# Patient Record
Sex: Male | Born: 1960 | Hispanic: Yes | Marital: Married | Smoking: Former smoker
Health system: Southern US, Community
[De-identification: ages and names within clinical notes are randomized; demographics above are authoritative.]

## PROBLEM LIST (undated history)

## (undated) DIAGNOSIS — D696 Thrombocytopenia, unspecified: Secondary | ICD-10-CM

## (undated) DIAGNOSIS — I10 Essential (primary) hypertension: Secondary | ICD-10-CM

## (undated) DIAGNOSIS — C8590 Non-Hodgkin lymphoma, unspecified, unspecified site: Secondary | ICD-10-CM

## (undated) DIAGNOSIS — E119 Type 2 diabetes mellitus without complications: Secondary | ICD-10-CM

## (undated) DIAGNOSIS — E785 Hyperlipidemia, unspecified: Secondary | ICD-10-CM

## (undated) DIAGNOSIS — K37 Unspecified appendicitis: Secondary | ICD-10-CM

## (undated) DIAGNOSIS — M722 Plantar fascial fibromatosis: Secondary | ICD-10-CM

## (undated) HISTORY — PX: UPPER GASTROINTESTINAL ENDOSCOPY: SHX188

## (undated) HISTORY — PX: COLONOSCOPY: SHX174

## (undated) HISTORY — PX: OTHER SURGICAL HISTORY: SHX169

## (undated) HISTORY — PX: ROTATOR CUFF REPAIR: SHX139

## (undated) HISTORY — DX: Thrombocytopenia, unspecified: D69.6

## (undated) HISTORY — PX: NECK LESION BIOPSY: SHX2078

## (undated) HISTORY — DX: Hyperlipidemia, unspecified: E78.5

## (undated) HISTORY — DX: Non-Hodgkin lymphoma, unspecified, unspecified site: C85.90

## (undated) HISTORY — DX: Unspecified appendicitis: K37

## (undated) HISTORY — DX: Plantar fascial fibromatosis: M72.2

---

## 2018-01-15 ENCOUNTER — Ambulatory Visit: Payer: Self-pay | Admitting: Physician Assistant

## 2018-08-13 DIAGNOSIS — E785 Hyperlipidemia, unspecified: Secondary | ICD-10-CM | POA: Diagnosis not present

## 2018-08-13 DIAGNOSIS — E1169 Type 2 diabetes mellitus with other specified complication: Secondary | ICD-10-CM | POA: Diagnosis not present

## 2018-09-05 DIAGNOSIS — Z794 Long term (current) use of insulin: Secondary | ICD-10-CM | POA: Diagnosis not present

## 2018-09-05 DIAGNOSIS — M75101 Unspecified rotator cuff tear or rupture of right shoulder, not specified as traumatic: Secondary | ICD-10-CM | POA: Diagnosis not present

## 2018-09-05 DIAGNOSIS — M5116 Intervertebral disc disorders with radiculopathy, lumbar region: Secondary | ICD-10-CM | POA: Diagnosis not present

## 2018-09-05 DIAGNOSIS — I10 Essential (primary) hypertension: Secondary | ICD-10-CM | POA: Diagnosis not present

## 2018-09-05 DIAGNOSIS — M75102 Unspecified rotator cuff tear or rupture of left shoulder, not specified as traumatic: Secondary | ICD-10-CM | POA: Diagnosis not present

## 2018-09-05 DIAGNOSIS — E119 Type 2 diabetes mellitus without complications: Secondary | ICD-10-CM | POA: Diagnosis not present

## 2018-09-05 DIAGNOSIS — E6609 Other obesity due to excess calories: Secondary | ICD-10-CM | POA: Diagnosis not present

## 2018-09-05 DIAGNOSIS — E785 Hyperlipidemia, unspecified: Secondary | ICD-10-CM | POA: Diagnosis not present

## 2018-09-05 DIAGNOSIS — R51 Headache: Secondary | ICD-10-CM | POA: Diagnosis not present

## 2018-09-05 DIAGNOSIS — G4700 Insomnia, unspecified: Secondary | ICD-10-CM | POA: Diagnosis not present

## 2018-12-18 ENCOUNTER — Telehealth (INDEPENDENT_AMBULATORY_CARE_PROVIDER_SITE_OTHER): Payer: Medicare HMO | Admitting: Emergency Medicine

## 2018-12-18 ENCOUNTER — Other Ambulatory Visit: Payer: Self-pay

## 2018-12-18 ENCOUNTER — Encounter: Payer: Self-pay | Admitting: Emergency Medicine

## 2018-12-18 DIAGNOSIS — E1159 Type 2 diabetes mellitus with other circulatory complications: Secondary | ICD-10-CM | POA: Diagnosis not present

## 2018-12-18 DIAGNOSIS — I152 Hypertension secondary to endocrine disorders: Secondary | ICD-10-CM | POA: Insufficient documentation

## 2018-12-18 DIAGNOSIS — I10 Essential (primary) hypertension: Secondary | ICD-10-CM

## 2018-12-18 DIAGNOSIS — E1165 Type 2 diabetes mellitus with hyperglycemia: Secondary | ICD-10-CM

## 2018-12-18 MED ORDER — ENALAPRIL-HYDROCHLOROTHIAZIDE 10-25 MG PO TABS: 1.0000 | ORAL_TABLET | Freq: Every day | ORAL | 1 refills | Status: DC

## 2018-12-18 MED ORDER — GLIPIZIDE 10 MG PO TABS: 10.0000 mg | ORAL_TABLET | Freq: Every day | ORAL | 1 refills | Status: DC

## 2018-12-18 NOTE — Progress Notes (Signed)
Telemedicine Encounter- SOAP NOTE Established Patient  This telephone encounter was conducted with the patient's (or proxy's) verbal consent via audio telecommunications: yes/no: Yes Patient was instructed to have this encounter in a suitably private space; and to only have persons present to whom they give permission to participate. In addition, patient identity was confirmed by use of name plus two identifiers (DOB and address).  I discussed the limitations, risks, security and privacy concerns of performing an evaluation and management service by telephone and the availability of in person appointments. I also discussed with the patient that there may be a patient responsible charge related to this service. The patient expressed understanding and agreed to proceed.  I spent a total of TIME; 0 MIN TO 60 MIN: 30 minutes talking with the patient or their proxy.  No chief complaint on file. Here to establish care  Subjective   Damon Dudley is a 58 y.o. male patient.  First visit to this office.  Telephone visit today to establish care.  Has the following chronic medical problems: 1.  Hypertension, on Vaseretic.  Under control. 2.  Diabetes, on metformin and glipizide, hypoglycemia at home, blood sugars between 180 and 210. 3.  Dyslipidemia, on statin. Asymptomatic.  No complaints or medical concerns today. Non-smoker.  Recently moved to the area from [place].   HPI   Patient Active Problem List   Diagnosis Date Noted  . Essential hypertension 12/18/2018    No past medical history on file.  Current Outpatient Medications  Medication Sig Dispense Refill  . atorvastatin (LIPITOR) 40 MG tablet Take 40 mg by mouth daily.    . enalapril-hydrochlorothiazide (VASERETIC) 10-25 MG tablet Take 1 tablet by mouth daily. 90 tablet 1  . glipiZIDE (GLUCOTROL) 10 MG tablet Take 1 tablet (10 mg total) by mouth daily before breakfast. 90 tablet 1  . Insulin Degludec (TRESIBA  FLEXTOUCH [place]) Inject 25 Units into the skin at bedtime.    . metFORMIN (GLUCOPHAGE) 1000 MG tablet Take 1,000 mg by mouth 2 (two) times daily with a meal.     No current facility-administered medications for this visit.     No Known Allergies  Social History   Socioeconomic History  . Marital status: Married    Spouse name: Not on file  . Number of children: Not on file  . Years of education: Not on file  . Highest education level: Not on file  Occupational History  . Not on file  Social Needs  . Financial resource strain: Not on file  . Food insecurity:    Worry: Not on file    Inability: Not on file  . Transportation needs:    Medical: Not on file    Non-medical: Not on file  Tobacco Use  . Smoking status: Not on file  Substance and Sexual Activity  . Alcohol use: Not on file  . Drug use: Not on file  . Sexual activity: Not on file  Lifestyle  . Physical activity:    Days per week: Not on file    Minutes per session: Not on file  . Stress: Not on file  Relationships  . Social connections:    Talks on phone: Not on file    Gets together: Not on file    Attends religious service: Not on file    Active member of club or organization: Not on file    Attends meetings of clubs or organizations: Not on file    Relationship  status: Not on file  . Intimate partner violence:    Fear of current or ex partner: Not on file    Emotionally abused: Not on file    Physically abused: Not on file    Forced sexual activity: Not on file  Other Topics Concern  . Not on file  Social History Narrative  . Not on file    Review of Systems  Constitutional: Negative.  Negative for chills, fever and weight loss.  HENT: Negative.  Negative for congestion, nosebleeds and sore throat.   Eyes: Negative.   Respiratory: Negative.  Negative for cough, shortness of breath and wheezing.   Cardiovascular: Negative.  Negative for chest pain and palpitations.  Gastrointestinal: Negative.   Negative for abdominal pain, blood in stool, diarrhea, nausea and vomiting.  Genitourinary: Negative.  Negative for dysuria.  Musculoskeletal: Negative for myalgias.  Skin: Negative.  Negative for rash.  Neurological: Negative.  Negative for dizziness and headaches.  Endo/Heme/Allergies: Negative.   All other systems reviewed and are negative.   Objective   Vitals as reported by the patient: None available There were no vitals filed for this visit.  Awake and oriented x3 in no respiratory distress.  With following: Surgical and protrusion remember referral yesterday yes she Max[place] trying to reach her GYN was the reason for her visit or maybe she was talking about today point been working on the last Diagnoses and all orders for this visit:  Hypertension associated with type 2 diabetes mellitus ([facility])  Essential hypertension -     enalapril-hydrochlorothiazide (VASERETIC) 10-25 MG tablet; Take 1 tablet by mouth daily.  Type 2 diabetes mellitus with hyperglycemia, without long-term current use of insulin ([facility]) -     glipiZIDE (GLUCOTROL) 10 MG tablet; Take 1 tablet (10 mg total) by mouth daily before breakfast.  Clinical stable.  Diabetes with hyperglycemia.  Continue present medications.  No change in medications.  Will adjust during office visit. Office visit in 1 to 2 months.    I discussed the assessment and treatment plan with the patient. The patient was provided an opportunity to ask questions and all were answered. The patient agreed with the plan and demonstrated an understanding of the instructions.   The patient was advised to call back or seek an in-person evaluation if the symptoms worsen or if the condition fails to improve as anticipated.  I provided 30 minutes of non-face-to-face time during this encounter.  Horald Pollen, MD  Primary Care at [facility]

## 2018-12-18 NOTE — Progress Notes (Signed)
Contacted patient to triage for appointment. Patient is new patient and need to establish care. Patient is a diabetic, he states yesterday his reading was 210 in the afternoon. Today this morning his reading was 187 after drinking coffee with powder creamer. Patient needs refills on enalapril-hctz and glipizide and these medications are pend.

## 2018-12-19 ENCOUNTER — Other Ambulatory Visit: Payer: Self-pay

## 2018-12-19 DIAGNOSIS — I10 Essential (primary) hypertension: Secondary | ICD-10-CM

## 2018-12-19 DIAGNOSIS — E1165 Type 2 diabetes mellitus with hyperglycemia: Secondary | ICD-10-CM

## 2018-12-19 MED ORDER — ENALAPRIL-HYDROCHLOROTHIAZIDE 10-25 MG PO TABS: 1.0000 | ORAL_TABLET | Freq: Every day | ORAL | 1 refills | Status: DC

## 2018-12-19 MED ORDER — GLIPIZIDE 10 MG PO TABS: 10.0000 mg | ORAL_TABLET | Freq: Every day | ORAL | 1 refills | Status: DC

## 2018-12-19 NOTE — Progress Notes (Signed)
Reordered to updated pharmacy, [place].

## 2019-01-16 DIAGNOSIS — Z03818 Encounter for observation for suspected exposure to other biological agents ruled out: Secondary | ICD-10-CM | POA: Diagnosis not present

## 2019-01-18 DIAGNOSIS — Z20828 Contact with and (suspected) exposure to other viral communicable diseases: Secondary | ICD-10-CM | POA: Diagnosis not present

## 2019-01-18 DIAGNOSIS — Z09 Encounter for follow-up examination after completed treatment for conditions other than malignant neoplasm: Secondary | ICD-10-CM | POA: Diagnosis not present

## 2019-02-27 ENCOUNTER — Telehealth: Payer: Self-pay | Admitting: Emergency Medicine

## 2019-02-27 NOTE — Telephone Encounter (Signed)
REFILL atorvastatin (LIPITOR) 40 MG tablet PHARMACY [facility] [place], [place] to [facility] [phone] (Phone) [phone] (Fax)

## 2019-03-02 ENCOUNTER — Encounter: Payer: Self-pay | Admitting: Emergency Medicine

## 2019-03-02 ENCOUNTER — Ambulatory Visit (INDEPENDENT_AMBULATORY_CARE_PROVIDER_SITE_OTHER): Payer: Medicare HMO | Admitting: Emergency Medicine

## 2019-03-02 ENCOUNTER — Other Ambulatory Visit: Payer: Self-pay

## 2019-03-02 VITALS — BP 107/72 | HR 100 | Temp 99.0°F | Resp 16 | Wt 184.8 lb

## 2019-03-02 DIAGNOSIS — E1165 Type 2 diabetes mellitus with hyperglycemia: Secondary | ICD-10-CM | POA: Diagnosis not present

## 2019-03-02 DIAGNOSIS — Z23 Encounter for immunization: Secondary | ICD-10-CM

## 2019-03-02 DIAGNOSIS — I10 Essential (primary) hypertension: Secondary | ICD-10-CM | POA: Diagnosis not present

## 2019-03-02 DIAGNOSIS — E1159 Type 2 diabetes mellitus with other circulatory complications: Secondary | ICD-10-CM | POA: Diagnosis not present

## 2019-03-02 MED ORDER — ENALAPRIL-HYDROCHLOROTHIAZIDE 10-25 MG PO TABS: 1.0000 | ORAL_TABLET | Freq: Every day | ORAL | 3 refills | Status: DC

## 2019-03-02 MED ORDER — ATORVASTATIN CALCIUM 40 MG PO TABS: 40.0000 mg | ORAL_TABLET | Freq: Every day | ORAL | 3 refills | Status: DC

## 2019-03-02 MED ORDER — GLIPIZIDE 10 MG PO TABS: 10.0000 mg | ORAL_TABLET | Freq: Every day | ORAL | 3 refills | Status: DC

## 2019-03-02 MED ORDER — METFORMIN HCL 1000 MG PO TABS: 1000.0000 mg | ORAL_TABLET | Freq: Two times a day (BID) | ORAL | 3 refills | Status: DC

## 2019-03-02 NOTE — Assessment & Plan Note (Signed)
Morning glucose at home elevated.  Will increase dose of Tresiba to 30 units at bedtime.  Follow-up in 3 months.

## 2019-03-02 NOTE — Patient Instructions (Addendum)
   If you have lab work done today you will be contacted with your lab results within the next 2 weeks.  If you have not heard from us then please contact us. The fastest way to get your results is to register for My Chart.   IF you received an x-ray today, you will receive an invoice from [facility]. Please contact [facility] at [phone] with questions or concerns regarding your invoice.   IF you received labwork today, you will receive an invoice from [facility]. Please contact [facility] at [phone] with questions or concerns regarding your invoice.   Our billing staff will not be able to assist you with questions regarding bills from these companies.  You will be contacted with the lab results as soon as they are available. The fastest way to get your results is to activate your My Chart account. Instructions are located on the last page of this paperwork. If you have not heard from us regarding the results in 2 weeks, please contact this office.     Diabetes mellitus y nutricin, en adultos Diabetes Mellitus and Nutrition, Adult Si sufre de diabetes (diabetes mellitus), es muy importante tener hbitos alimenticios saludables debido a que sus niveles de azcar en la sangre (glucosa) se ven afectados en gran medida por lo que come y bebe. [place]er alimentos saludables en las cantidades adecuadas, aproximadamente a la misma hora todos los das, lo ayudar a:  Controlar la glucemia.  Disminuir el riesgo de sufrir una enfermedad cardaca.  Mejorar la presin arterial.  Alcanzar o mantener un peso saludable. Todas las personas que sufren de diabetes son diferentes y cada una tiene necesidades diferentes en cuanto a un plan de alimentacin. El mdico puede recomendarle que trabaje con un especialista en dietas y nutricin (nutricionista) para elaborar el mejor plan para usted. Su plan de alimentacin puede variar segn factores como:  Las caloras que  necesita.  Los medicamentos que toma.  Su peso.  Sus niveles de glucemia, presin arterial y colesterol.  Su nivel de actividad.  Otras afecciones que tenga, como enfermedades cardacas o renales. Cmo me afectan los carbohidratos? Los carbohidratos, o hidratos de carbono, afectan su nivel de glucemia ms que cualquier otro tipo de alimento. La ingesta de carbohidratos naturalmente aumenta la cantidad de glucosa en la sangre. El recuento de carbohidratos es un mtodo destinado a llevar un registro de la cantidad de carbohidratos que se consumen. El recuento de carbohidratos es importante para mantener la glucemia a un nivel saludable, especialmente si utiliza insulina o toma determinados medicamentos por va oral para la diabetes. Es importante conocer la cantidad de carbohidratos que se pueden ingerir en cada comida sin correr ningn riesgo. Esto es diferente en cada persona. Su nutricionista puede ayudarlo a calcular la cantidad de carbohidratos que debe ingerir en cada comida y en cada refrigerio. Entre los alimentos que contienen carbohidratos, se incluyen:  Pan, cereal, arroz, pastas y galletas.  Papas y maz.  Guisantes, frijoles y lentejas.  Leche y yogur.  Frutas y jugo.  Postres, como pasteles, galletas, helado y caramelos. Cmo me afecta el alcohol? El alcohol puede provocar disminuciones sbitas de la glucemia (hipoglucemia), especialmente si utiliza insulina o toma determinados medicamentos por va oral para la diabetes. La hipoglucemia es una afeccin potencialmente mortal. Los sntomas de la hipoglucemia (somnolencia, mareos y confusin) son similares a los sntomas de haber consumido demasiado alcohol. Si el mdico afirma que el alcohol es seguro para usted, siga estas pautas:    Limite el consumo de alcohol a no ms de 1medida por da si es mujer y no est embarazada, y a 2medidas si es hombre. Una medida equivale a 12oz (355ml) de cerveza, 5oz (148ml) de vino o  1oz (44ml) de bebidas alcohlicas de alta graduacin.  No beba con el estmago vaco.  Mantngase hidratado bebiendo agua, refrescos dietticos o t helado sin azcar.  Tenga en cuenta que los refrescos comunes, los jugos y otras bebida para mezclar pueden contener mucha azcar y se deben contar como carbohidratos. Cules son algunos consejos para seguir este plan?  Leer las etiquetas de los alimentos  Comience por leer el tamao de la porcin en la "Informacin nutricional" en las etiquetas de los alimentos envasados y las bebidas. La cantidad de caloras, carbohidratos, grasas y otros nutrientes mencionados en la etiqueta se basan en una porcin del alimento. Muchos alimentos contienen ms de una porcin por envase.  Verifique la cantidad total de gramos (g) de carbohidratos totales en una porcin. Puede calcular la cantidad de porciones de carbohidratos al dividir el total de carbohidratos por 15. Por ejemplo, si un alimento tiene un total de 30g de carbohidratos, equivale a 2 porciones de carbohidratos.  Verifique la cantidad de gramos (g) de grasas saturadas y grasas trans en una porcin. Escoja alimentos que no contengan grasa o que tengan un bajo contenido.  Verifique la cantidad de miligramos (mg) de sal (sodio) en una porcin. La mayora de las personas deben limitar la ingesta de sodio total a menos de 2300mg por da.  Siempre consulte la informacin nutricional de los alimentos etiquetados como "con bajo contenido de grasa" o "sin grasa". Estos alimentos pueden tener un mayor contenido de azcar agregada o carbohidratos refinados, y deben evitarse.  Hable con su nutricionista para identificar sus objetivos diarios en cuanto a los nutrientes mencionados en la etiqueta. Al ir de compras  Evite comprar alimentos procesados, enlatados o precocinados. Estos alimentos tienden a tener una mayor cantidad de grasa, sodio y azcar agregada.  Compre en la zona exterior de la tienda de  comestibles. Esta zona incluye frutas y verduras frescas, granos a granel, carnes frescas y productos lcteos frescos. Al cocinar  Utilice mtodos de coccin a baja temperatura, como hornear, en lugar de mtodos de coccin a alta temperatura, como frer en abundante aceite.  Cocine con aceites saludables, como el aceite de oliva, canola o girasol.  Evite cocinar con manteca, crema o carnes con alto contenido de grasa. Planificacin de las comidas  Coma las comidas y los refrigerios regularmente, preferentemente a la misma hora todos los das. Evite pasar largos perodos de tiempo sin comer.  Consuma alimentos ricos en fibra, como frutas frescas, verduras, frijoles y cereales integrales. Consulte a su nutricionista sobre cuntas porciones de carbohidratos puede consumir en cada comida.  Consuma entre 4 y 6 onzas (oz) de protenas magras por da, como carnes magras, pollo, pescado, huevos o tofu. Una onza de protena magra equivale a: ? 1 onza de carne, pollo o pescado. ? 1huevo. ?  taza de tofu.  Coma algunos alimentos por da que contengan grasas saludables, como aguacates, frutos secos, semillas y pescado. Estilo de vida  Controle su nivel de glucemia con regularidad.  Haga actividad fsica habitualmente como se lo haya indicado el mdico. Esto puede incluir lo siguiente: ? 150minutos semanales de ejercicio de intensidad moderada o alta. Esto podra incluir caminatas dinmicas, ciclismo o gimnasia acutica. ? Realizar ejercicios de elongacin y de fortalecimiento, como yoga o levantamiento   de pesas, por lo menos 2veces por semana.  Tome los [employer] se lo haya indicado el mdico.  No consuma ningn producto que contenga nicotina o tabaco, como cigarrillos y Psychologist, sport and exercise. Si necesita ayuda para dejar de fumar, consulte al [employer] con un asesor o instructor en diabetes para identificar estrategias para controlar el estrs y cualquier desafo emocional  y social. Preguntas para hacerle al mdico  Es necesario que consulte a Radio broadcast assistant en el cuidado de la diabetes?  Es necesario que me rena con un nutricionista?  A qu nmero puedo llamar si tengo preguntas?  Cules son los mejores momentos para controlar la glucemia? Dnde encontrar ms informacin:  Asociacin Estadounidense de la Diabetes (American Diabetes Association): diabetes.org  Academia de Nutricin y Information systems manager (Academy of Nutrition and Dietetics): www.eatright.[place] Diabetes y las Enfermedades Digestivas y [facility] of Diabetes and Digestive and Kidney Diseases, NIH): [employer] Resumen  Un plan de alimentacin saludable lo ayudar a Aeronautical engineer glucemia y Theatre manager un estilo de vida saludable.  Trabajar con un especialista en dietas y nutricin (nutricionista) puede ayudarlo a Insurance claims handler de alimentacin para usted.  Tenga en cuenta que los carbohidratos (hidratos de carbono) y el alcohol tienen efectos inmediatos en sus niveles de glucemia. Es importante contar los carbohidratos que ingiere y consumir alcohol con prudencia. Esta informacin no tiene Marine scientist el consejo del mdico. Asegrese de hacerle al mdico cualquier pregunta que tenga. Document Released: 11/20/2007 Document Revised: 04/23/2017 Document Reviewed: 12/03/2016 Elsevier Patient Education  2020 [employer].

## 2019-03-02 NOTE — Progress Notes (Signed)
Damon Dudley 58 y.o.   Chief Complaint  Patient presents with   Diabetes   Medication Refill    all except INSULIN    HISTORY OF PRESENT ILLNESS: This is a 58 y.o. male with history of hypertension and diabetes here for follow-up and medication refill.  Has no complaints or medical concerns today.  Blood glucose at home in the morning has been around 180.  HPI   Prior to Admission medications   Medication Sig Start Date End Date Taking? Authorizing Provider  atorvastatin (LIPITOR) 40 MG tablet Take 40 mg by mouth daily.   Yes [provider]  enalapril-hydrochlorothiazide (VASERETIC) 10-25 MG tablet Take 1 tablet by mouth daily. 12/19/18  Yes Damon Dudley, Damon Bloomer, MD  glipiZIDE (GLUCOTROL) 10 MG tablet Take 1 tablet (10 mg total) by mouth daily before breakfast. 12/19/18  Yes Damon Dudley, Damon Bloomer, MD  Insulin Degludec (TRESIBA FLEXTOUCH [place]) Inject 25 Units into the skin at bedtime.   Yes [provider]  metFORMIN (GLUCOPHAGE) 1000 MG tablet Take 1,000 mg by mouth 2 (two) times daily with a meal.   Yes [provider]    No Known Allergies  Patient Active Problem List   Diagnosis Date Noted   Hypertension associated with type 2 diabetes mellitus ([place]) 12/18/2018   Type 2 diabetes mellitus with hyperglycemia, without long-term current use of insulin ([place]) 12/18/2018    History reviewed. No pertinent past medical history.  History reviewed. No pertinent surgical history.  Social History   Socioeconomic History   Marital status: Married    Spouse name: Not on file   Number of children: Not on file   Years of education: Not on file   Highest education level: Not on file  Occupational History   Not on file  Social Needs   Financial resource strain: Not on file   Food insecurity    Worry: Not on file    Inability: Not on file   Transportation needs    Medical: Not on file    Non-medical: Not on file  Tobacco Use   Smoking  status: Former Smoker    Quit date: 08/27/2010    Years since quitting: 8.5   Smokeless tobacco: Never Used  Substance and Sexual Activity   Alcohol use: Not Currently   Drug use: Never   Sexual activity: Not on file  Lifestyle   Physical activity    Days per week: Not on file    Minutes per session: Not on file   Stress: Not on file  Relationships   Social connections    Talks on phone: Not on file    Gets together: Not on file    Attends religious service: Not on file    Active member of club or organization: Not on file    Attends meetings of clubs or organizations: Not on file    Relationship status: Not on file   Intimate partner violence    Fear of current or ex partner: Not on file    Emotionally abused: Not on file    Physically abused: Not on file    Forced sexual activity: Not on file  Other Topics Concern   Not on file  Social History Narrative   Not on file    History reviewed. No pertinent family history.   Review of Systems  Constitutional: Negative.  Negative for chills and fever.  HENT: Negative for congestion, nosebleeds and sore throat.   Eyes: Negative.   Respiratory: Negative.  Negative for cough and shortness of breath.   Cardiovascular: Negative.  Negative for chest pain and palpitations.  Gastrointestinal: Negative for abdominal pain, diarrhea, nausea and vomiting.  Genitourinary: Negative.   Musculoskeletal: Negative.  Negative for myalgias and neck pain.  Skin: Negative.  Negative for rash.  Neurological: Negative.  Negative for dizziness and headaches.  Endo/Heme/Allergies: Negative.   All other systems reviewed and are negative.  Vitals:   03/02/19 1556  BP: 107/72  Pulse: 100  Resp: 16  Temp: 99 F (37.2 C)  SpO2: 96%     Physical Exam Vitals signs reviewed.  Constitutional:      Appearance: Normal appearance.  HENT:     Head: Normocephalic and atraumatic.  Eyes:     Extraocular Movements: Extraocular movements  intact.     Conjunctiva/sclera: Conjunctivae normal.     Pupils: Pupils are equal, round, and reactive to light.  Neck:     Musculoskeletal: Normal range of motion and neck supple.  Cardiovascular:     Rate and Rhythm: Normal rate and regular rhythm.     Pulses: Normal pulses.     Heart sounds: Normal heart sounds.  Pulmonary:     Effort: Pulmonary effort is normal.  Abdominal:     Palpations: Abdomen is soft.     Tenderness: There is no abdominal tenderness.  Musculoskeletal: Normal range of motion.  Skin:    General: Skin is warm.     Capillary Refill: Capillary refill takes less than 2 seconds.  Neurological:     General: No focal deficit present.     Mental Status: He is alert and oriented to person, place, and time.  Psychiatric:        Mood and Affect: Mood normal.        Behavior: Behavior normal.      ASSESSMENT & PLAN: Type 2 diabetes mellitus with hyperglycemia, without long-term current use of insulin (HCC) Morning glucose at home elevated.  Will increase dose of Tresiba to 30 units at bedtime.  Follow-up in 3 months.  Damon Dudley was seen today for diabetes and medication refill.  Diagnoses and all orders for this visit:  Type 2 diabetes mellitus with hyperglycemia, without long-term current use of insulin (HCC) -     CBC with Differential/Platelet -     Comprehensive metabolic panel -     Hemoglobin A1c -     Lipid panel -     HM Diabetes Foot Exam -     metFORMIN (GLUCOPHAGE) 1000 MG tablet; Take 1 tablet (1,000 mg total) by mouth 2 (two) times daily with a meal. -     glipiZIDE (GLUCOTROL) 10 MG tablet; Take 1 tablet (10 mg total) by mouth daily before breakfast. -     atorvastatin (LIPITOR) 40 MG tablet; Take 1 tablet (40 mg total) by mouth daily.  Hypertension associated with type 2 diabetes mellitus (HCC) -     CBC with Differential/Platelet -     Comprehensive metabolic panel -     Hemoglobin A1c -     Lipid panel  Need for prophylactic vaccination  against Streptococcus pneumoniae (pneumococcus) -     Pneumococcal polysaccharide vaccine 23-valent greater than or equal to 2yo subcutaneous/IM  Essential hypertension -     enalapril-hydrochlorothiazide (VASERETIC) 10-25 MG tablet; Take 1 tablet by mouth daily.    Patient Instructions       If you have lab work done today you will be contacted with your lab results within the next  2 weeks.  If you have not heard from [place] then please contact us. The fastest way to get your results is to register for My Chart.   IF you received an x-ray today, you will receive an invoice from [facility]. Please contact [facility] at [phone] with questions or concerns regarding your invoice.   IF you received labwork today, you will receive an invoice from [facility]. Please contact [facility] at [phone] with questions or concerns regarding your invoice.   Our billing staff will not be able to assist you with questions regarding bills from these companies.  You will be contacted with the lab results as soon as they are available. The fastest way to get your results is to activate your My Chart account. Instructions are located on the last page of this paperwork. If you have not heard from [place] regarding the results in 2 weeks, please contact this office.     Diabetes mellitus y nutricin, en adultos Diabetes Mellitus and Nutrition, Adult Si sufre de diabetes (diabetes mellitus), es muy importante tener hbitos alimenticios saludables debido a que sus niveles de Designer, television/film set sangre (glucosa) se ven afectados en gran medida por lo que come y bebe. [place] alimentos saludables en las cantidades [place], aproximadamente a la [employer], [place] ayudar a:  Aeronautical engineer glucemia.  Disminuir el riesgo de sufrir una enfermedad cardaca.  Mejorar la presin arterial.  Science writer o mantener un peso saludable. Todas las personas que sufren de diabetes son diferentes y cada  una tiene necesidades diferentes en cuanto a un plan de alimentacin. El mdico puede recomendarle que trabaje con un especialista en dietas y nutricin (nutricionista) para Financial trader plan para usted. Su plan de alimentacin puede variar segn factores como:  Las caloras que necesita.  Los medicamentos que toma.  Su peso.  Sus niveles de glucemia, presin arterial y colesterol.  Su nivel de [place].  Otras afecciones que tenga, como enfermedades cardacas o renales. Cmo me afectan los carbohidratos? Los carbohidratos, o hidratos de carbono, afectan su nivel de glucemia ms que cualquier otro tipo de alimento. La ingesta de carbohidratos naturalmente aumenta la cantidad de [employer]. El recuento de carbohidratos es un mtodo destinado a Catering manager un registro de la cantidad de carbohidratos que se consumen. El recuento de carbohidratos es importante para Theatre manager la glucemia a un nivel saludable, especialmente si utiliza insulina o toma determinados medicamentos por va oral para la diabetes. Es importante conocer la cantidad de carbohidratos que se pueden ingerir en cada comida sin correr Engineer, manufacturing. Esto es Psychologist, forensic. Su nutricionista puede ayudarlo a calcular la cantidad de carbohidratos que debe ingerir en cada comida y en cada refrigerio. Entre los alimentos que contienen carbohidratos, se incluyen:  Pan, cereal, arroz, pastas y galletas.  Papas y maz.  Guisantes, frijoles y lentejas.  Leche y Estate agent.  Lambert Mody y [place].  Postres, como pasteles, galletas, helado y caramelos. Cmo me afecta el alcohol? El alcohol puede provocar disminuciones sbitas de la glucemia (hipoglucemia), especialmente si utiliza insulina o toma determinados medicamentos por va oral para la diabetes. La hipoglucemia es una afeccin potencialmente mortal. Los sntomas de la hipoglucemia (somnolencia, mareos y confusin) son similares a los sntomas de haber consumido  demasiado alcohol. Si el mdico afirma que el alcohol es seguro para usted, siga estas pautas:  Limite el consumo de alcohol a no ms de 21medida por da si es mujer y no est [place], y  a 46medidas si es hombre. Una medida equivale a 12oz (348ml) de cerveza, 5oz (159ml) de vino o 1oz (84ml) de bebidas alcohlicas de alta graduacin.  No beba con el estmago vaco.  Mantngase hidratado bebiendo agua, refrescos dietticos o t helado sin azcar.  Tenga en cuenta que los refrescos comunes, los jugos y otras bebida para Optician, dispensing pueden contener mucha azcar y se deben contar como carbohidratos. Cules son algunos consejos para seguir este plan?  Leer las etiquetas de los alimentos  Comience por leer el tamao de la porcin en la Informacin nutricional en las etiquetas de los alimentos envasados y las bebidas. La cantidad de caloras, carbohidratos, grasas y otros nutrientes mencionados en la etiqueta se basan en una porcin del alimento. Muchos alimentos contienen ms de una porcin por envase.  Verifique la cantidad total de gramos (g) de carbohidratos totales en una porcin. Puede calcular la cantidad de porciones de carbohidratos al dividir el total de carbohidratos por 15. Por ejemplo, si un alimento tiene un total de 30g de carbohidratos, equivale a 2 porciones de carbohidratos.  Verifique la cantidad de gramos (g) de grasas saturadas y grasas trans en una porcin. Escoja alimentos que no contengan grasa o que tengan un bajo contenido.  Verifique la cantidad de miligramos (mg) de sal (sodio) en una porcin. La mayora de las personas deben limitar la ingesta de sodio total a menos de 2300mg  por Training and development officer.  Siempre consulte la informacin nutricional de los alimentos etiquetados como con bajo contenido de [place] o sin grasa. Estos alimentos pueden tener un mayor contenido de Location manager agregada o carbohidratos refinados, y deben evitarse.  Hable con su nutricionista para identificar  sus objetivos diarios en cuanto a los nutrientes mencionados en la etiqueta. Al ir de compras  Evite comprar alimentos procesados, enlatados o precocinados. Estos alimentos tienden a Special educational needs teacher mayor cantidad de [place], sodio y azcar agregada.  Compre en la zona exterior de la tienda de comestibles. Esta zona incluye frutas y verduras frescas, granos a granel, carnes frescas y productos lcteos frescos. Al cocinar  Utilice mtodos de coccin a baja temperatura, como hornear, en lugar de mtodos de coccin a alta temperatura, como frer en abundante aceite.  Cocine con aceites saludables, como el aceite de [place], canola o [place].  Evite cocinar con manteca, crema o carnes con alto contenido de grasa. Planificacin de las comidas  Coma las comidas y los refrigerios regularmente, preferentemente a la misma hora todos [place]. Evite pasar largos perodos de tiempo sin comer.  Consuma alimentos ricos en fibra, como frutas frescas, verduras, frijoles y cereales integrales. Consulte a su nutricionista sobre cuntas porciones de carbohidratos puede consumir en cada comida.  Consuma entre 4 y 6 onzas (oz) de protenas magras por da, como carnes [place], pollo, pescado, huevos o tofu. Una onza de protena magra equivale a: ? 1 onza de carne, pollo o pescado. ? 1huevo. ?  taza de tofu.  Coma algunos alimentos por da que contengan grasas saludables, como aguacates, frutos secos, semillas y pescado. Estilo de vida  Controle su nivel de glucemia con regularidad.  Haga actividad fsica habitualmente como se lo haya indicado el mdico. Esto puede incluir lo siguiente: ? 120minutos semanales de ejercicio de intensidad moderada o alta. Esto podra incluir caminatas dinmicas, ciclismo o gimnasia acutica. ? Realizar ejercicios de elongacin y de fortalecimiento, como yoga o levantamiento de pesas, por lo menos 2veces por semana.  Tome los [employer] se lo haya indicado el mdico.  No  consuma ningn producto que contenga nicotina o tabaco, como cigarrillos y Psychologist, sport and exercise. Si necesita ayuda para dejar de fumar, consulte al [employer] con un asesor o instructor en diabetes para identificar estrategias para controlar el estrs y cualquier desafo emocional y social. Preguntas para hacerle al mdico  Es necesario que consulte a Radio broadcast assistant en el cuidado de la diabetes?  Es necesario que me rena con un nutricionista?  A qu nmero puedo llamar si tengo preguntas?  Cules son los mejores momentos para controlar la glucemia? Dnde encontrar ms informacin:  Asociacin Estadounidense de la Diabetes (American Diabetes Association): diabetes.org  Academia de Nutricin y Information systems manager (Academy of Nutrition and Dietetics): www.eatright.Early Diabetes y las Enfermedades Digestivas y [place] [facility] of Diabetes and Digestive and Kidney Diseases, NIH): [employer].dk Resumen  Un plan de alimentacin saludable lo ayudar a Aeronautical engineer glucemia y Theatre manager un estilo de vida saludable.  Trabajar con un especialista en dietas y nutricin (nutricionista) puede ayudarlo a Insurance claims handler de alimentacin para usted.  Tenga en cuenta que los carbohidratos (hidratos de carbono) y el alcohol tienen efectos inmediatos en sus niveles de glucemia. Es importante contar los carbohidratos que ingiere y consumir alcohol con prudencia. Esta informacin no tiene Marine scientist el consejo del mdico. Asegrese de hacerle al mdico cualquier pregunta que tenga. Document Released: 11/20/2007 Document Revised: 04/23/2017 Document Reviewed: 12/03/2016 Elsevier Patient Education  2020 Elsevier Inc.      Agustina Caroli, MD [facility]

## 2019-03-02 NOTE — Telephone Encounter (Signed)
Pt has appointment today with provider to discuses medication and refills.

## 2019-03-03 LAB — LIPID PANEL
Chol/HDL Ratio: 2.9 ratio (ref 0.0–5.0)
Cholesterol, Total: 168 mg/dL (ref 100–199)
HDL: 57 mg/dL (ref >39)
LDL Calculated: 91 mg/dL (ref 0–99)
Triglycerides: 102 mg/dL (ref 0–149)
VLDL Cholesterol Cal: 20 mg/dL (ref 5–40)

## 2019-03-03 LAB — COMPREHENSIVE METABOLIC PANEL
ALT: 26 IU/L (ref 0–44)
AST: 19 IU/L (ref 0–40)
Albumin/Globulin Ratio: 2.7 — ABNORMAL HIGH (ref 1.2–2.2)
Albumin: 4.9 g/dL (ref 3.8–4.9)
Alkaline Phosphatase: 82 IU/L (ref 39–117)
BUN/Creatinine Ratio: 21 (ref 10–24)
BUN: 15 mg/dL (ref 8–27)
Bilirubin Total: 0.3 mg/dL (ref 0.0–1.2)
CO2: 25 mmol/L (ref 20–29)
Calcium: 10.8 mg/dL — ABNORMAL HIGH (ref 8.6–10.2)
Chloride: 102 mmol/L (ref 96–106)
Creatinine, Ser: 0.7 mg/dL — ABNORMAL LOW (ref 0.76–1.27)
GFR calc Af Amer: 119 mL/min/{1.73_m2} (ref >59)
GFR calc non Af Amer: 103 mL/min/{1.73_m2} (ref >59)
Globulin, Total: 1.8 g/dL (ref 1.5–4.5)
Glucose: 102 mg/dL — ABNORMAL HIGH (ref 65–99)
Potassium: 4 mmol/L (ref 3.5–5.2)
Sodium: 143 mmol/L (ref 134–144)
Total Protein: 6.7 g/dL (ref 6.0–8.5)

## 2019-03-03 LAB — CBC WITH DIFFERENTIAL/PLATELET
Basophils Absolute: 0.1 10*3/uL (ref 0.0–0.2)
Basos: 1 %
EOS (ABSOLUTE): 0.3 10*3/uL (ref 0.0–0.4)
Eos: 4 %
Hematocrit: 44.1 % (ref 37.5–51.0)
Hemoglobin: 14.6 g/dL (ref 13.0–17.7)
Immature Grans (Abs): 0 10*3/uL (ref 0.0–0.1)
Immature Granulocytes: 0 %
Lymphocytes Absolute: 2.3 10*3/uL (ref 0.7–3.1)
Lymphs: 30 %
MCH: 28.6 pg (ref 26.6–33.0)
MCHC: 33.1 g/dL (ref 31.5–35.7)
MCV: 87 fL (ref 79–97)
Monocytes Absolute: 0.5 10*3/uL (ref 0.1–0.9)
Monocytes: 7 %
Neutrophils Absolute: 4.7 10*3/uL (ref 1.4–7.0)
Neutrophils: 58 %
Platelets: 297 10*3/uL (ref 150–450)
RBC: 5.1 x10E6/uL (ref 4.14–5.80)
RDW: 13.4 % (ref 11.6–15.4)
WBC: 7.9 10*3/uL (ref 3.4–10.8)

## 2019-03-03 LAB — HEMOGLOBIN A1C
Est. average glucose Bld gHb Est-mCnc: 243 mg/dL
Hgb A1c MFr Bld: 10.1 % — ABNORMAL HIGH (ref 4.8–5.6)

## 2019-03-03 NOTE — Telephone Encounter (Signed)
Blood results discussed with patient. 

## 2019-05-06 ENCOUNTER — Encounter: Payer: Self-pay | Admitting: Podiatry

## 2019-05-06 ENCOUNTER — Ambulatory Visit (INDEPENDENT_AMBULATORY_CARE_PROVIDER_SITE_OTHER): Payer: Medicare HMO | Admitting: Podiatry

## 2019-05-06 ENCOUNTER — Other Ambulatory Visit: Payer: Self-pay

## 2019-05-06 VITALS — BP 138/79 | HR 79 | Resp 16

## 2019-05-06 DIAGNOSIS — L600 Ingrowing nail: Secondary | ICD-10-CM

## 2019-05-06 NOTE — Patient Instructions (Signed)

## 2019-05-06 NOTE — Progress Notes (Signed)
   Subjective:    Patient ID: Damon Dudley, male    DOB: Aug 03, 1961, 58 y.o.   MRN: [id number]  HPI    Review of Systems  All other systems reviewed and are negative.      Objective:   Physical Exam        Assessment & Plan:

## 2019-05-09 NOTE — Progress Notes (Signed)
Subjective:   Patient ID: Damon Dudley, male   DOB: 58 y.o.   MRN: [id number]   HPI Patient states has had problems with both his big toenails and is interested in some type of procedure to try to get rid of them but he like them to grow back if possible.  Patient does not smoke likes to be active   Review of Systems  All other systems reviewed and are negative.       Objective:  Physical Exam Vitals signs and nursing note reviewed.  Constitutional:      Appearance: He is well-developed.  Pulmonary:     Effort: Pulmonary effort is normal.  Musculoskeletal: Normal range of motion.  Skin:    General: Skin is warm.  Neurological:     Mental Status: He is alert.     Neurovascular status intact muscle strength was found to be adequate with patient any diabetes under good control with damaged hallux nail plates bilateral that are loose and painful with probable trauma as precipitating factor F2     Assessment:  Damaged hallux nail bilateral that are loose and possibility for future infection     Plan:  H&P reviewed condition recommended nail removal allowing new nail to regrow.  Patient wants to undergo this understanding he may be permanent procedure Sunday and today I infiltrated each hallux 60 mg like Marcaine mixture sterile prep applied using sterile instrumentation remove the hallux nail flush the bed applied sterile dressings gave instructions on soaks

## 2019-05-11 DIAGNOSIS — H524 Presbyopia: Secondary | ICD-10-CM | POA: Diagnosis not present

## 2019-05-11 DIAGNOSIS — E119 Type 2 diabetes mellitus without complications: Secondary | ICD-10-CM | POA: Diagnosis not present

## 2019-05-11 LAB — HM DIABETES EYE EXAM

## 2019-05-21 ENCOUNTER — Encounter: Payer: Self-pay | Admitting: *Deleted

## 2019-06-01 ENCOUNTER — Other Ambulatory Visit: Payer: Self-pay

## 2019-06-01 ENCOUNTER — Ambulatory Visit (INDEPENDENT_AMBULATORY_CARE_PROVIDER_SITE_OTHER): Payer: Medicare HMO | Admitting: Emergency Medicine

## 2019-06-01 ENCOUNTER — Ambulatory Visit: Payer: Medicare HMO | Admitting: Podiatry

## 2019-06-01 ENCOUNTER — Encounter: Payer: Self-pay | Admitting: Emergency Medicine

## 2019-06-01 VITALS — BP 116/72 | HR 72 | Temp 98.0°F | Resp 16 | Ht 64.0 in | Wt 188.0 lb

## 2019-06-01 DIAGNOSIS — E1165 Type 2 diabetes mellitus with hyperglycemia: Secondary | ICD-10-CM | POA: Diagnosis not present

## 2019-06-01 DIAGNOSIS — I10 Essential (primary) hypertension: Secondary | ICD-10-CM | POA: Diagnosis not present

## 2019-06-01 DIAGNOSIS — E1159 Type 2 diabetes mellitus with other circulatory complications: Secondary | ICD-10-CM | POA: Diagnosis not present

## 2019-06-01 LAB — POCT GLYCOSYLATED HEMOGLOBIN (HGB A1C): Hemoglobin A1C: 7.8 % — AB (ref 4.0–5.6)

## 2019-06-01 LAB — GLUCOSE, POCT (MANUAL RESULT ENTRY): POC Glucose: 95 mg/dl (ref 70–99)

## 2019-06-01 MED ORDER — ENALAPRIL-HYDROCHLOROTHIAZIDE 10-25 MG PO TABS: 1.0000 | ORAL_TABLET | Freq: Every day | ORAL | 3 refills | Status: DC

## 2019-06-01 MED ORDER — METFORMIN HCL 1000 MG PO TABS: 1000.0000 mg | ORAL_TABLET | Freq: Two times a day (BID) | ORAL | 3 refills | Status: DC

## 2019-06-01 MED ORDER — TRESIBA FLEXTOUCH 100 UNIT/ML ~~LOC~~ SOPN: 25.0000 [IU] | PEN_INJECTOR | Freq: Every day | SUBCUTANEOUS | 11 refills | Status: DC

## 2019-06-01 MED ORDER — ATORVASTATIN CALCIUM 40 MG PO TABS: 40.0000 mg | ORAL_TABLET | Freq: Every day | ORAL | 3 refills | Status: DC

## 2019-06-01 NOTE — Progress Notes (Signed)
Lab Results  Component Value Date   HGBA1C 10.1 (H) 03/02/2019   BP Readings from Last 3 Encounters:  05/06/19 138/79  03/02/19 107/72   Lab Results  Component Value Date   CHOL 168 03/02/2019   HDL 57 03/02/2019   LDLCALC 91 03/02/2019   TRIG 102 03/02/2019   CHOLHDL 2.9 03/02/2019   Lab Results  Component Value Date   CREATININE 0.70 (L) 03/02/2019   BUN 15 03/02/2019   NA 143 03/02/2019   K 4.0 03/02/2019   CL 102 03/02/2019   CO2 25 03/02/2019   Damon Dudley 58 y.o.   No chief complaint on file.   HISTORY OF PRESENT ILLNESS: This is a 58 y.o. male with history of diabetes, hypertension, and dyslipidemia, here for follow-up. #1 diabetes insulin-dependent: On Tresiba 30 units at bedtime, metformin 1000 mg twice a day, and glipizide 10 mg in the afternoon. Has had 2 episodes of low blood sugar at about 3-4 a.m. has been able to correct these with bedtime snacks. #2 hypertension: On Vaseretic 10-25.  Normal blood pressure readings at home number. #3 dyslipidemia: On atorvastatin 40 mg daily. Has no complaints or medical concerns today.  HPI   Prior to Admission medications   Medication Sig Start Date End Date Taking? Authorizing Provider  atorvastatin (LIPITOR) 40 MG tablet Take 1 tablet (40 mg total) by mouth daily. 03/02/19 05/31/19  Horald Pollen, MD  enalapril-hydrochlorothiazide (VASERETIC) 10-25 MG tablet Take 1 tablet by mouth daily. 03/02/19   Horald Pollen, MD  glipiZIDE (GLUCOTROL) 10 MG tablet Take 1 tablet (10 mg total) by mouth daily before breakfast. 03/02/19   Horald Pollen, MD  Insulin Degludec (TRESIBA FLEXTOUCH [place]) Inject 25 Units into the skin at bedtime.    [provider]  metFORMIN (GLUCOPHAGE) 1000 MG tablet Take 1 tablet (1,000 mg total) by mouth 2 (two) times daily with a meal. 03/02/19 05/31/19  Horald Pollen, MD    No Known Allergies  Patient Active Problem List   Diagnosis Date Noted  . Hypertension  associated with type 2 diabetes mellitus ([place]) 12/18/2018  . Type 2 diabetes mellitus with hyperglycemia, without long-term current use of insulin ([place]) 12/18/2018    History reviewed. No pertinent past medical history.  History reviewed. No pertinent surgical history.  Social History   Socioeconomic History  . Marital status: Married    Spouse name: Not on file  . Number of children: Not on file  . Years of education: Not on file  . Highest education level: Not on file  Occupational History  . Not on file  Social Needs  . Financial resource strain: Not on file  . Food insecurity    Worry: Not on file    Inability: Not on file  . Transportation needs    Medical: Not on file    Non-medical: Not on file  Tobacco Use  . Smoking status: Former Smoker    Quit date: 08/27/2010    Years since quitting: 8.7  . Smokeless tobacco: Never Used  Substance and Sexual Activity  . Alcohol use: Not Currently  . Drug use: Never  . Sexual activity: Not on file  Lifestyle  . Physical activity    Days per week: Not on file    Minutes per session: Not on file  . Stress: Not on file  Relationships  . Social Herbalist on phone: Not on file    Gets together: Not on file  Attends religious service: Not on file    Active member of club or organization: Not on file    Attends meetings of clubs or organizations: Not on file    Relationship status: Not on file  . Intimate partner violence    Fear of current or ex partner: Not on file    Emotionally abused: Not on file    Physically abused: Not on file    Forced sexual activity: Not on file  Other Topics Concern  . Not on file  Social History Narrative  . Not on file    History reviewed. No pertinent family history.   Review of Systems  Constitutional: Negative.  Negative for chills and fever.  HENT: Negative.  Negative for congestion and sore throat.   Eyes: Negative.   Respiratory: Negative.  Negative for cough and  shortness of breath.   Cardiovascular: Negative.  Negative for chest pain and palpitations.  Gastrointestinal: Negative.  Negative for abdominal pain, diarrhea, nausea and vomiting.  Genitourinary: Negative.   Musculoskeletal: Negative.  Negative for myalgias.  Skin: Negative.  Negative for rash.  Neurological: Negative for dizziness and headaches.  Endo/Heme/Allergies: Negative.   All other systems reviewed and are negative.  Vitals:   06/01/19 0854  BP: 116/72  Pulse: 72  Resp: 16  Temp: 98 F (36.7 C)  SpO2: 97%     Physical Exam Vitals signs reviewed.  Constitutional:      Appearance: Normal appearance.  HENT:     Head: Normocephalic.  Eyes:     Extraocular Movements: Extraocular movements intact.     Conjunctiva/sclera: Conjunctivae normal.     Pupils: Pupils are equal, round, and reactive to light.  Neck:     Musculoskeletal: Normal range of motion and neck supple.  Cardiovascular:     Rate and Rhythm: Normal rate and regular rhythm.     Pulses: Normal pulses.     Heart sounds: Normal heart sounds.  Pulmonary:     Effort: Pulmonary effort is normal.     Breath sounds: Normal breath sounds.  Abdominal:     Palpations: Abdomen is soft.     Tenderness: There is no abdominal tenderness.  Musculoskeletal: Normal range of motion.  Skin:    General: Skin is warm and dry.     Capillary Refill: Capillary refill takes less than 2 seconds.  Neurological:     General: No focal deficit present.     Mental Status: He is alert and oriented to person, place, and time.  Psychiatric:        Mood and Affect: Mood normal.        Behavior: Behavior normal.    Results for orders placed or performed in visit on 06/01/19 (from the past 24 hour(s))  POCT glucose (manual entry)     Status: None   Collection Time: 06/01/19  9:02 AM  Result Value Ref Range   POC Glucose 95 70 - 99 mg/dl  POCT glycosylated hemoglobin (Hb A1C)     Status: Abnormal   Collection Time: 06/01/19   9:04 AM  Result Value Ref Range   Hemoglobin A1C 7.8 (A) 4.0 - 5.6 %   HbA1c POC (<> result, manual entry)     HbA1c, POC (prediabetic range)     HbA1c, POC (controlled diabetic range)       ASSESSMENT & PLAN: Hypertension associated with type 2 diabetes mellitus ([place]) Well controlled blood pressure.  Continue present medication. Improved diabetes with hemoglobin A1c at 7.8.  Continue present medications.  No changes.  Hypoglycemia precautions given.  Advised to preferably take glipizide in the morning with breakfast.  Encouraged to take a bedtime snack. Follow-up in 3 months.  Raney was seen today for diabetes.  Diagnoses and all orders for this visit:  Hypertension associated with diabetes ([place]) -     POCT glucose (manual entry) -     POCT glycosylated hemoglobin (Hb A1C)  Hypertension associated with type 2 diabetes mellitus ([place])  Type 2 diabetes mellitus with hyperglycemia, without long-term current use of insulin ([facility]) -     atorvastatin (LIPITOR) 40 MG tablet; Take 1 tablet (40 mg total) by mouth daily. -     insulin degludec (TRESIBA FLEXTOUCH) 100 UNIT/ML SOPN FlexTouch Pen; Inject 0.25 mLs (25 Units total) into the skin at bedtime. -     metFORMIN (GLUCOPHAGE) 1000 MG tablet; Take 1 tablet (1,000 mg total) by mouth 2 (two) times daily with a meal.  Essential hypertension -     enalapril-hydrochlorothiazide (VASERETIC) 10-25 MG tablet; Take 1 tablet by mouth daily.    Patient Instructions  Diabetes mellitus y nutricin, en adultos Diabetes Mellitus and Nutrition, Adult Si sufre de diabetes (diabetes mellitus), es muy importante tener hbitos alimenticios saludables debido a que sus niveles de Designer, television/film set sangre (glucosa) se ven afectados en gran medida por lo que come y bebe. [place] alimentos saludables en las cantidades [place], aproximadamente a la [employer], [place] ayudar a:  Aeronautical engineer glucemia.  Disminuir el riesgo de sufrir una enfermedad  cardaca.  Mejorar la presin arterial.  Science writer o mantener un peso saludable. Todas las personas que sufren de diabetes son diferentes y cada una tiene necesidades diferentes en cuanto a un plan de alimentacin. El mdico puede recomendarle que trabaje con un especialista en dietas y nutricin (nutricionista) para Financial trader plan para usted. Su plan de alimentacin puede variar segn factores como:  Las caloras que necesita.  Los medicamentos que toma.  Su peso.  Sus niveles de glucemia, presin arterial y colesterol.  Su nivel de [place].  Otras afecciones que tenga, como enfermedades cardacas o renales. Cmo me afectan los carbohidratos? Los carbohidratos, o hidratos de carbono, afectan su nivel de glucemia ms que cualquier otro tipo de alimento. La ingesta de carbohidratos naturalmente aumenta la cantidad de [employer]. El recuento de carbohidratos es un mtodo destinado a Catering manager un registro de la cantidad de carbohidratos que se consumen. El recuento de carbohidratos es importante para Theatre manager la glucemia a un nivel saludable, especialmente si utiliza insulina o toma determinados medicamentos por va oral para la diabetes. Es importante conocer la cantidad de carbohidratos que se pueden ingerir en cada comida sin correr Engineer, manufacturing. Esto es Psychologist, forensic. Su nutricionista puede ayudarlo a calcular la cantidad de carbohidratos que debe ingerir en cada comida y en cada refrigerio. Entre los alimentos que contienen carbohidratos, se incluyen:  Pan, cereal, arroz, pastas y galletas.  Papas y maz.  Guisantes, frijoles y lentejas.  Leche y Estate agent.  Lambert Mody y [place].  Postres, como pasteles, galletas, helado y caramelos. Cmo me afecta el alcohol? El alcohol puede provocar disminuciones sbitas de la glucemia (hipoglucemia), especialmente si utiliza insulina o toma determinados medicamentos por va oral para la diabetes. La hipoglucemia es una  afeccin potencialmente mortal. Los sntomas de la hipoglucemia (somnolencia, mareos y confusin) son similares a los sntomas de haber consumido demasiado alcohol. Si el mdico afirma que el alcohol  es seguro para usted, siga estas pautas:  Limite el consumo de alcohol a no ms de 15medida por da si es mujer y no est [place], y a 95medidas si es hombre. Una medida equivale a 12oz (367ml) de cerveza, 5oz (119ml) de vino o 1oz (80ml) de bebidas alcohlicas de alta graduacin.  No beba con el estmago vaco.  Mantngase hidratado bebiendo agua, refrescos dietticos o t helado sin azcar.  Tenga en cuenta que los refrescos comunes, los jugos y otras bebida para Optician, dispensing pueden contener mucha azcar y se deben contar como carbohidratos. Cules son algunos consejos para seguir este plan?  Leer las etiquetas de los alimentos  Comience por leer el tamao de la porcin en la "Informacin nutricional" en las etiquetas de los alimentos envasados y las bebidas. La cantidad de caloras, carbohidratos, grasas y otros nutrientes mencionados en la etiqueta se basan en una porcin del alimento. Muchos alimentos contienen ms de una porcin por envase.  Verifique la cantidad total de gramos (g) de carbohidratos totales en una porcin. Puede calcular la cantidad de porciones de carbohidratos al dividir el total de carbohidratos por 15. Por ejemplo, si un alimento tiene un total de 30g de carbohidratos, equivale a 2 porciones de carbohidratos.  Verifique la cantidad de gramos (g) de grasas saturadas y grasas trans en una porcin. Escoja alimentos que no contengan grasa o que tengan un bajo contenido.  Verifique la cantidad de miligramos (mg) de sal (sodio) en una porcin. [place] [employer] de las personas deben limitar la ingesta de sodio total a menos de 2300mg  por Training and development officer.  Siempre consulte la informacin nutricional de los alimentos etiquetados como "con bajo contenido de grasa" o "sin grasa". Estos  alimentos pueden tener un mayor contenido de Location manager agregada o carbohidratos refinados, y deben evitarse.  Hable con su nutricionista para identificar sus objetivos diarios en cuanto a los nutrientes mencionados en la etiqueta. Al ir de compras  Evite comprar alimentos procesados, enlatados o precocinados. Estos alimentos tienden a Special educational needs teacher mayor cantidad de [place], sodio y azcar agregada.  Compre en la zona exterior de la tienda de comestibles. Esta zona incluye frutas y verduras frescas, granos a granel, carnes frescas y productos lcteos frescos. Al cocinar  Utilice mtodos de coccin a baja temperatura, como hornear, en lugar de mtodos de coccin a alta temperatura, como frer en abundante aceite.  Cocine con aceites saludables, como el aceite de [place], canola o [place].  Evite cocinar con manteca, crema o carnes con alto contenido de grasa. Planificacin de las comidas  Coma las comidas y los refrigerios regularmente, preferentemente a la misma hora todos [place]. Evite pasar largos perodos de tiempo sin comer.  Consuma alimentos ricos en fibra, como frutas frescas, verduras, frijoles y cereales integrales. Consulte a su nutricionista sobre cuntas porciones de carbohidratos puede consumir en cada comida.  Consuma entre 4 y 6 onzas (oz) de protenas magras por da, como carnes [place], pollo, pescado, huevos o tofu. Una onza de protena magra equivale a: ? 1 onza de carne, pollo o pescado. ? 1huevo. ?  taza de tofu.  Coma algunos alimentos por da que contengan grasas saludables, como aguacates, frutos secos, semillas y pescado. Estilo de vida  Controle su nivel de glucemia con regularidad.  Haga actividad fsica habitualmente como se lo haya indicado el mdico. Esto puede incluir lo siguiente: ? 197minutos semanales de ejercicio de intensidad moderada o alta. Esto podra incluir caminatas dinmicas, ciclismo o gimnasia acutica. ? Realizar ejercicios de elongacin  y de  fortalecimiento, como yoga o levantamiento de pesas, por lo menos 2veces por semana.  Tome los [employer] se lo haya indicado el mdico.  No consuma ningn producto que contenga nicotina o tabaco, como cigarrillos y Psychologist, sport and exercise. Si necesita ayuda para dejar de fumar, consulte al [employer] con un asesor o instructor en diabetes para identificar estrategias para controlar el estrs y cualquier desafo emocional y social. Preguntas para hacerle al mdico  Es necesario que consulte a Radio broadcast assistant en el cuidado de la diabetes?  Es necesario que me rena con un nutricionista?  A qu nmero puedo llamar si tengo preguntas?  Cules son los mejores momentos para controlar la glucemia? Dnde encontrar ms informacin:  Asociacin Estadounidense de la Diabetes (American Diabetes Association): diabetes.org  Academia de Nutricin y Information systems manager (Academy of Nutrition and Dietetics): www.eatright.[place] Diabetes y las Enfermedades Digestivas y [facility] of Diabetes and Digestive and Kidney Diseases, NIH): [employer].dk Resumen  Un plan de alimentacin saludable lo ayudar a Aeronautical engineer glucemia y Theatre manager un estilo de vida saludable.  Trabajar con un especialista en dietas y nutricin (nutricionista) puede ayudarlo a Insurance claims handler de alimentacin para usted.  Tenga en cuenta que los carbohidratos (hidratos de carbono) y el alcohol tienen efectos inmediatos en sus niveles de glucemia. Es importante contar los carbohidratos que ingiere y consumir alcohol con prudencia. Esta informacin no tiene Marine scientist el consejo del mdico. Asegrese de hacerle al mdico cualquier pregunta que tenga. Document Released: 11/20/2007 Document Revised: 04/23/2017 Document Reviewed: 12/03/2016 Elsevier Patient Education  2020 Elsevier Inc.      Agustina Caroli, MD [facility]

## 2019-06-01 NOTE — Assessment & Plan Note (Addendum)
Well controlled blood pressure.  Continue present medication. Improved diabetes with hemoglobin A1c at 7.8.  Continue present medications.  No changes.  Hypoglycemia precautions given.  Advised to preferably take glipizide in the morning with breakfast.  Encouraged to take a bedtime snack. Follow-up in 3 months.

## 2019-06-01 NOTE — Patient Instructions (Signed)
Diabetes mellitus y nutricin, en adultos Diabetes Mellitus and Nutrition, Adult Si sufre de diabetes (diabetes mellitus), es muy importante tener hbitos alimenticios saludables debido a que sus niveles de Designer, television/film set sangre (glucosa) se ven afectados en gran medida por lo que come y bebe. [place] alimentos saludables en las cantidades [place], aproximadamente a la [employer], [place] ayudar a:  Aeronautical engineer glucemia.  Disminuir el riesgo de sufrir una enfermedad cardaca.  Mejorar la presin arterial.  Science writer o mantener un peso saludable. Todas las personas que sufren de diabetes son diferentes y cada una tiene necesidades diferentes en cuanto a un plan de alimentacin. El mdico puede recomendarle que trabaje con un especialista en dietas y nutricin (nutricionista) para Financial trader plan para usted. Su plan de alimentacin puede variar segn factores como:  Las caloras que necesita.  Los medicamentos que toma.  Su peso.  Sus niveles de glucemia, presin arterial y colesterol.  Su nivel de [place].  Otras afecciones que tenga, como enfermedades cardacas o renales. Cmo me afectan los carbohidratos? Los carbohidratos, o hidratos de carbono, afectan su nivel de glucemia ms que cualquier otro tipo de alimento. La ingesta de carbohidratos naturalmente aumenta la cantidad de [employer]. El recuento de carbohidratos es un mtodo destinado a Catering manager un registro de la cantidad de carbohidratos que se consumen. El recuento de carbohidratos es importante para Theatre manager la glucemia a un nivel saludable, especialmente si utiliza insulina o toma determinados medicamentos por va oral para la diabetes. Es importante conocer la cantidad de carbohidratos que se pueden ingerir en cada comida sin correr Engineer, manufacturing. Esto es Psychologist, forensic. Su nutricionista puede ayudarlo a calcular la cantidad de carbohidratos que debe ingerir en cada comida y en cada  refrigerio. Entre los alimentos que contienen carbohidratos, se incluyen:  Pan, cereal, arroz, pastas y galletas.  Papas y maz.  Guisantes, frijoles y lentejas.  Leche y Estate agent.  Lambert Mody y [place].  Postres, como pasteles, galletas, helado y caramelos. Cmo me afecta el alcohol? El alcohol puede provocar disminuciones sbitas de la glucemia (hipoglucemia), especialmente si utiliza insulina o toma determinados medicamentos por va oral para la diabetes. La hipoglucemia es una afeccin potencialmente mortal. Los sntomas de la hipoglucemia (somnolencia, mareos y confusin) son similares a los sntomas de haber consumido demasiado alcohol. Si el mdico afirma que el alcohol es seguro para usted, [place] estas pautas:  Limite el consumo de alcohol a no ms de 26mdida por da si es mujer y no est [place] y a 219midas si es hombre. Una medida equivale a 12oz (35564mde cerveza, 5oz (148m60me vino o 1oz (44ml75m bebidas alcohlicas de alta graduacin.  No beba con el estmago vaco.  Mantngase hidratado bebiendo agua, refrescos dietticos o t helado sin azcar.  Tenga en cuenta que los refrescos comunes, los jugos y otras bebida para mezclOptician, dispensingen contener mucha azcar y se deben contar como carbohidratos. Cules son algunos consejos para seguir este plan?  Leer las etiquetas de los alimentos  Comience por leer el tamao de la porcin en la "Informacin nutricional" en las etiquetas de los alimentos envasados y las bebidas. La cantidad de caloras, carbohidratos, grasas y otros nutrientes mencionados en la etiqueta se basan en una porcin del alimento. Muchos alimentos contienen ms de una porcin por envase.  Verifique la cantidad total de gramos (g) de carbohidratos totales en una porcin. Puede calcular la cantidad de porciones de carbohidratos al dividir el  Muchos alimentos contienen ms de una porcin por envase.   Verifique la cantidad total de gramos (g) de carbohidratos totales en una porcin. Puede calcular la cantidad de porciones de carbohidratos al dividir el total de carbohidratos por 15. Por ejemplo, si un alimento tiene un total de 30g de carbohidratos, equivale a 2  porciones de carbohidratos.   Verifique la cantidad de gramos (g) de grasas saturadas y grasas trans en una porcin. Escoja alimentos que no contengan grasa o que tengan un bajo contenido.   Verifique la cantidad de miligramos (mg) de sal (sodio) en una porcin. La mayora de las personas deben limitar la ingesta de sodio total a menos de 2300mg por da.   Siempre consulte la informacin nutricional de los alimentos etiquetados como "con bajo contenido de grasa" o "sin grasa". Estos alimentos pueden tener un mayor contenido de azcar agregada o carbohidratos refinados, y deben evitarse.   Hable con su nutricionista para identificar sus objetivos diarios en cuanto a los nutrientes mencionados en la etiqueta.  Al ir de compras   Evite comprar alimentos procesados, enlatados o precocinados. Estos alimentos tienden a tener una mayor cantidad de grasa, sodio y azcar agregada.   Compre en la zona exterior de la tienda de comestibles. Esta zona incluye frutas y verduras frescas, granos a granel, carnes frescas y productos lcteos frescos.  Al cocinar   Utilice mtodos de coccin a baja temperatura, como hornear, en lugar de mtodos de coccin a alta temperatura, como frer en abundante aceite.   Cocine con aceites saludables, como el aceite de ol[place], canola o girasol.   Evite cocinar con manteca, crema o carnes con alto contenido de grasa.  Planificacin de las comidas   Coma las comidas y los refrigerios regularmente, preferentemente a la misma hora todos los das. Evite pasar largos perodos de tiempo sin comer.   Consuma alimentos ricos en fibra, como frutas frescas, verduras, frijoles y cereales integrales. Consulte a su nutricionista sobre cuntas porciones de carbohidratos puede consumir en cada comida.   Consuma entre 4 y 6 onzas (oz) de protenas magras por da, como carnes magras, pollo, pescado, huevos o tofu. Una onza de protena magra equivale a:  ? 1 onza de carne, pollo o  pescado.  ? 1huevo.  ?  taza de tofu.   Coma algunos alimentos por da que contengan grasas saludables, como aguacates, frutos secos, semillas y pescado.  Estilo de vida   Controle su nivel de glucemia con regularidad.   Haga actividad fsica habitualmente como se lo haya indicado el mdico. Esto puede incluir lo siguiente:  ? 150minutos semanales de ejercicio de intensidad moderada o alta. Esto podra incluir caminatas dinmicas, ciclismo o gimnasia acutica.  ? Realizar ejercicios de elongacin y de fortalecimiento, como yoga o levantamiento de pesas, por lo menos 2veces por semana.   Tome los medicamentos como se lo haya indicado el mdico.   No consuma ningn producto que contenga nicotina o tabaco, como cigarrillos y cigarrillos electrnicos. Si necesita ayuda para dejar de fumar, consulte al mdico.   Trabaje con un asesor o instructor en diabetes para identificar estrategias para controlar el estrs y cualquier desafo emocional y social.  Preguntas para hacerle al mdico   Es necesario que consulte a un instructor en el cuidado de la diabetes?   Es necesario que me rena con un nutricionista?   A qu nmero puedo llamar si tengo preguntas?   Cules son los mejores momentos para controlar la glucemia?  Dnde   Trabajar con un especialista en dietas y nutricin (nutricionista) puede ayudarlo a elaborar el mejor plan de alimentacin para usted.  Tenga en cuenta que los carbohidratos (hidratos de carbono) y el alcohol tienen  efectos inmediatos en sus niveles de glucemia. Es importante contar los carbohidratos que ingiere y consumir alcohol con prudencia. Esta informacin no tiene como fin reemplazar el consejo del mdico. Asegrese de hacerle al mdico cualquier pregunta que tenga. Document Released: 11/20/2007 Document Revised: 04/23/2017 Document Reviewed: 12/03/2016 Elsevier Patient Education  2020 Elsevier Inc.  

## 2019-06-03 ENCOUNTER — Ambulatory Visit: Payer: Medicare HMO | Admitting: Podiatry

## 2019-06-03 ENCOUNTER — Other Ambulatory Visit: Payer: Self-pay

## 2019-06-03 ENCOUNTER — Encounter: Payer: Self-pay | Admitting: Podiatry

## 2019-06-03 DIAGNOSIS — L600 Ingrowing nail: Secondary | ICD-10-CM | POA: Diagnosis not present

## 2019-06-03 MED ORDER — NEOMYCIN-POLYMYXIN-HC 3.5-10000-1 OT SOLN: OTIC | 0 refills | Status: DC

## 2019-06-03 NOTE — Patient Instructions (Signed)

## 2019-06-07 NOTE — Progress Notes (Signed)
Subjective:   Patient ID: Damon Dudley, male   DOB: 58 y.o.   MRN: [id number]   HPI Patient presents with painful nailbed left hallux lateral border that is been going on for around a month and is worse at nighttime.  States that she is tried to trim it herself.  Patient does not smoke likes to be active   ROS      Objective:  Physical Exam  Neurovascular status intact muscle strength adequate with the other nail that we corrected doing very well with an incurvated left hallux lateral border that is painful     Assessment:  Ingrown toenail deformity left hallux lateral border with pain     Plan:  H&P reviewed condition recommended correction and allowed him to sign consent form after review.  Today I infiltrated the left hallux 60 mg like Marcaine mixture sterile prep applied to the toe and using sterile instrumentation I remove the border exposed matrix and applied phenol 3 applications 36 followed by alcohol lavage and sterile dressing.  Gave instructions on soaks and reappoint to recheck

## 2019-07-01 ENCOUNTER — Telehealth: Payer: Self-pay | Admitting: Emergency Medicine

## 2019-07-01 NOTE — Telephone Encounter (Signed)
Dariann is requesting a one touch Verio test stripes and lancets for pt. Pharmacy:  [facility] [place], [place] [facility]  Please advise Dariann at [phone]

## 2019-07-06 NOTE — Telephone Encounter (Signed)
LVM to call office back about Rx request.

## 2019-07-08 ENCOUNTER — Telehealth: Payer: Self-pay | Admitting: Emergency Medicine

## 2019-07-08 NOTE — Telephone Encounter (Signed)
°  Patient is needing Dr Mitchel Honour nurse to give him a call about medication

## 2019-07-16 ENCOUNTER — Telehealth: Payer: Self-pay | Admitting: *Deleted

## 2019-07-16 NOTE — Telephone Encounter (Signed)
Tried to call pt about Rx question LVM to call office back about concerns.  Rx has been faxed to pharmacy

## 2019-07-16 NOTE — Telephone Encounter (Signed)
Pt asking if test strips were sent to  [facility] on file

## 2019-07-16 NOTE — Telephone Encounter (Signed)
On 07/15/2019, faxed Rx request for One Touch test kit with supplies with one refill to [facility]. Confirmation page 5:40 pm. 

## 2019-07-17 NOTE — Telephone Encounter (Signed)
Spoke with pt and he states he will receive Rx in a few days. He has no other concerns

## 2019-07-25 DIAGNOSIS — R69 Illness, unspecified: Secondary | ICD-10-CM | POA: Diagnosis not present

## 2019-09-01 ENCOUNTER — Other Ambulatory Visit: Payer: Self-pay

## 2019-09-01 ENCOUNTER — Encounter: Payer: Self-pay | Admitting: Emergency Medicine

## 2019-09-01 ENCOUNTER — Telehealth: Payer: Self-pay | Admitting: *Deleted

## 2019-09-01 ENCOUNTER — Ambulatory Visit: Payer: Medicare HMO | Admitting: Emergency Medicine

## 2019-09-01 VITALS — BP 122/74 | HR 74 | Temp 97.9°F | Resp 16 | Ht 64.0 in | Wt 188.0 lb

## 2019-09-01 DIAGNOSIS — Z23 Encounter for immunization: Secondary | ICD-10-CM

## 2019-09-01 DIAGNOSIS — I10 Essential (primary) hypertension: Secondary | ICD-10-CM

## 2019-09-01 DIAGNOSIS — E1159 Type 2 diabetes mellitus with other circulatory complications: Secondary | ICD-10-CM | POA: Diagnosis not present

## 2019-09-01 DIAGNOSIS — E1165 Type 2 diabetes mellitus with hyperglycemia: Secondary | ICD-10-CM | POA: Diagnosis not present

## 2019-09-01 DIAGNOSIS — I152 Hypertension secondary to endocrine disorders: Secondary | ICD-10-CM

## 2019-09-01 LAB — POCT GLYCOSYLATED HEMOGLOBIN (HGB A1C): Hemoglobin A1C: 8.7 % — AB (ref 4.0–5.6)

## 2019-09-01 LAB — GLUCOSE, POCT (MANUAL RESULT ENTRY): POC Glucose: 99 mg/dl (ref 70–99)

## 2019-09-01 MED ORDER — ATOVAQUONE-PROGUANIL HCL 250-100 MG PO TABS: 1.0000 | ORAL_TABLET | Freq: Every day | ORAL | 0 refills | Status: DC

## 2019-09-01 MED ORDER — TRULICITY 0.75 MG/0.5ML ~~LOC~~ SOAJ: 0.7500 mg | SUBCUTANEOUS | 5 refills | Status: AC

## 2019-09-01 MED ORDER — GLIPIZIDE 10 MG PO TABS: 10.0000 mg | ORAL_TABLET | Freq: Every day | ORAL | 3 refills | Status: DC

## 2019-09-01 NOTE — Telephone Encounter (Signed)
[facility] at [phone] to cancel Rx for Malarone. I spoke to [place] at the pharmacy and she documented the request to send to her supervisor for cancellation. This Rx was for another patient.

## 2019-09-01 NOTE — Assessment & Plan Note (Signed)
Well-controlled hypertension.  Continue present medication. Uncontrolled diabetes with hemoglobin A1c higher than before at 8.7.  Patient compliant with medications.  Continue metformin 1000 mg twice a day, glipizide 10 mg in the morning with breakfast, Tresiba 25 units at bedtime.  Add Trulicity [id number] mg weekly.  Diet and nutrition discussed with patient.  Follow-up in 3 months.

## 2019-09-01 NOTE — Patient Instructions (Addendum)
   If you have lab work done today you will be contacted with your lab results within the next 2 weeks.  If you have not heard from us then please contact us. The fastest way to get your results is to register for My Chart.   IF you received an x-ray today, you will receive an invoice from [facility]. Please contact [facility] at [phone] with questions or concerns regarding your invoice.   IF you received labwork today, you will receive an invoice from [facility]. Please contact [facility] at [phone] with questions or concerns regarding your invoice.   Our billing staff will not be able to assist you with questions regarding bills from these companies.  You will be contacted with the lab results as soon as they are available. The fastest way to get your results is to activate your My Chart account. Instructions are located on the last page of this paperwork. If you have not heard from us regarding the results in 2 weeks, please contact this office.     Diabetes mellitus y nutricin, en adultos Diabetes Mellitus and Nutrition, Adult Si sufre de diabetes (diabetes mellitus), es muy importante tener hbitos alimenticios saludables debido a que sus niveles de azcar en la sangre (glucosa) se ven afectados en gran medida por lo que come y bebe. [place]er alimentos saludables en las cantidades adecuadas, aproximadamente a la misma hora todos los das, lo ayudar a:  Controlar la glucemia.  Disminuir el riesgo de sufrir una enfermedad cardaca.  Mejorar la presin arterial.  Alcanzar o mantener un peso saludable. Todas las personas que sufren de diabetes son diferentes y cada una tiene necesidades diferentes en cuanto a un plan de alimentacin. El mdico puede recomendarle que trabaje con un especialista en dietas y nutricin (nutricionista) para elaborar el mejor plan para usted. Su plan de alimentacin puede variar segn factores como:  Las caloras que  necesita.  Los medicamentos que toma.  Su peso.  Sus niveles de glucemia, presin arterial y colesterol.  Su nivel de actividad.  Otras afecciones que tenga, como enfermedades cardacas o renales. Cmo me afectan los carbohidratos? Los carbohidratos, o hidratos de carbono, afectan su nivel de glucemia ms que cualquier otro tipo de alimento. La ingesta de carbohidratos naturalmente aumenta la cantidad de glucosa en la sangre. El recuento de carbohidratos es un mtodo destinado a llevar un registro de la cantidad de carbohidratos que se consumen. El recuento de carbohidratos es importante para mantener la glucemia a un nivel saludable, especialmente si utiliza insulina o toma determinados medicamentos por va oral para la diabetes. Es importante conocer la cantidad de carbohidratos que se pueden ingerir en cada comida sin correr ningn riesgo. Esto es diferente en cada persona. Su nutricionista puede ayudarlo a calcular la cantidad de carbohidratos que debe ingerir en cada comida y en cada refrigerio. Entre los alimentos que contienen carbohidratos, se incluyen:  Pan, cereal, arroz, pastas y galletas.  Papas y maz.  Guisantes, frijoles y lentejas.  Leche y yogur.  Frutas y jugo.  Postres, como pasteles, galletas, helado y caramelos. Cmo me afecta el alcohol? El alcohol puede provocar disminuciones sbitas de la glucemia (hipoglucemia), especialmente si utiliza insulina o toma determinados medicamentos por va oral para la diabetes. La hipoglucemia es una afeccin potencialmente mortal. Los sntomas de la hipoglucemia (somnolencia, mareos y confusin) son similares a los sntomas de haber consumido demasiado alcohol. Si el mdico afirma que el alcohol es seguro para usted, siga estas pautas:    Limite el consumo de alcohol a no ms de 1medida por da si es mujer y no est embarazada, y a 2medidas si es hombre. Una medida equivale a 12oz (355ml) de cerveza, 5oz (148ml) de vino o  1oz (44ml) de bebidas alcohlicas de alta graduacin.  No beba con el estmago vaco.  Mantngase hidratado bebiendo agua, refrescos dietticos o t helado sin azcar.  Tenga en cuenta que los refrescos comunes, los jugos y otras bebida para mezclar pueden contener mucha azcar y se deben contar como carbohidratos. Cules son algunos consejos para seguir este plan?  Leer las etiquetas de los alimentos  Comience por leer el tamao de la porcin en la "Informacin nutricional" en las etiquetas de los alimentos envasados y las bebidas. La cantidad de caloras, carbohidratos, grasas y otros nutrientes mencionados en la etiqueta se basan en una porcin del alimento. Muchos alimentos contienen ms de una porcin por envase.  Verifique la cantidad total de gramos (g) de carbohidratos totales en una porcin. Puede calcular la cantidad de porciones de carbohidratos al dividir el total de carbohidratos por 15. Por ejemplo, si un alimento tiene un total de 30g de carbohidratos, equivale a 2 porciones de carbohidratos.  Verifique la cantidad de gramos (g) de grasas saturadas y grasas trans en una porcin. Escoja alimentos que no contengan grasa o que tengan un bajo contenido.  Verifique la cantidad de miligramos (mg) de sal (sodio) en una porcin. La mayora de las personas deben limitar la ingesta de sodio total a menos de 2300mg por da.  Siempre consulte la informacin nutricional de los alimentos etiquetados como "con bajo contenido de grasa" o "sin grasa". Estos alimentos pueden tener un mayor contenido de azcar agregada o carbohidratos refinados, y deben evitarse.  Hable con su nutricionista para identificar sus objetivos diarios en cuanto a los nutrientes mencionados en la etiqueta. Al ir de compras  Evite comprar alimentos procesados, enlatados o precocinados. Estos alimentos tienden a tener una mayor cantidad de grasa, sodio y azcar agregada.  Compre en la zona exterior de la tienda de  comestibles. Esta zona incluye frutas y verduras frescas, granos a granel, carnes frescas y productos lcteos frescos. Al cocinar  Utilice mtodos de coccin a baja temperatura, como hornear, en lugar de mtodos de coccin a alta temperatura, como frer en abundante aceite.  Cocine con aceites saludables, como el aceite de oliva, canola o girasol.  Evite cocinar con manteca, crema o carnes con alto contenido de grasa. Planificacin de las comidas  Coma las comidas y los refrigerios regularmente, preferentemente a la misma hora todos los das. Evite pasar largos perodos de tiempo sin comer.  Consuma alimentos ricos en fibra, como frutas frescas, verduras, frijoles y cereales integrales. Consulte a su nutricionista sobre cuntas porciones de carbohidratos puede consumir en cada comida.  Consuma entre 4 y 6 onzas (oz) de protenas magras por da, como carnes magras, pollo, pescado, huevos o tofu. Una onza de protena magra equivale a: ? 1 onza de carne, pollo o pescado. ? 1huevo. ?  taza de tofu.  Coma algunos alimentos por da que contengan grasas saludables, como aguacates, frutos secos, semillas y pescado. Estilo de vida  Controle su nivel de glucemia con regularidad.  Haga actividad fsica habitualmente como se lo haya indicado el mdico. Esto puede incluir lo siguiente: ? 150minutos semanales de ejercicio de intensidad moderada o alta. Esto podra incluir caminatas dinmicas, ciclismo o gimnasia acutica. ? Realizar ejercicios de elongacin y de fortalecimiento, como yoga o levantamiento   de pesas, por lo menos 2veces por semana.  Tome los medicamentos como se lo haya indicado el mdico.  No consuma ningn producto que contenga nicotina o tabaco, como cigarrillos y cigarrillos electrnicos. Si necesita ayuda para dejar de fumar, consulte al mdico.  Trabaje con un asesor o instructor en diabetes para identificar estrategias para controlar el estrs y cualquier desafo emocional  y social. Preguntas para hacerle al mdico  Es necesario que consulte a un instructor en el cuidado de la diabetes?  Es necesario que me rena con un nutricionista?  A qu nmero puedo llamar si tengo preguntas?  Cules son los mejores momentos para controlar la glucemia? Dnde encontrar ms informacin:  Asociacin Estadounidense de la Diabetes (American Diabetes Association): diabetes.org  Academia de Nutricin y Diettica (Academy of Nutrition and Dietetics): www.eatright.org  Instituto Nacional de la Diabetes y las Enfermedades Digestivas y Renales (National Institute of Diabetes and Digestive and Kidney Diseases, NIH): www.niddk.nih.gov Resumen  Un plan de alimentacin saludable lo ayudar a controlar la glucemia y mantener un estilo de vida saludable.  Trabajar con un especialista en dietas y nutricin (nutricionista) puede ayudarlo a elaborar el mejor plan de alimentacin para usted.  Tenga en cuenta que los carbohidratos (hidratos de carbono) y el alcohol tienen efectos inmediatos en sus niveles de glucemia. Es importante contar los carbohidratos que ingiere y consumir alcohol con prudencia. Esta informacin no tiene como fin reemplazar el consejo del mdico. Asegrese de hacerle al mdico cualquier pregunta que tenga. Document Revised: 04/23/2017 Document Reviewed: 12/03/2016 Elsevier Patient Education  2020 [employer]  

## 2019-09-01 NOTE — Progress Notes (Signed)
Lab Results  Component Value Date   HGBA1C 7.8 (A) 06/01/2019   BP Readings from Last 3 Encounters:  06/01/19 116/72  05/06/19 138/79  03/02/19 107/72   Wt Readings from Last 3 Encounters:  06/01/19 188 lb (85.3 kg)  03/02/19 184 lb 12.8 oz (83.8 kg)   [name] 59 y.o.   Chief Complaint  Patient presents with  . Diabetes    3 month follow up    HISTORY OF PRESENT ILLNESS: This is a 59 y.o. male with history of diabetes and hypertension here for follow-up and medication refill. 1.  Diabetes, compliant with medications: Metformin 1000 mg twice a day, glipizide 10 mg in the morning, Tresiba 25 units at bedtime. Blood glucose in the morning between 85-110. 2.  Hypertension: On Vaseretic 10-25 mg daily.  No complaints. 3.  Dyslipidemia: On atorvastatin 40 mg daily. Feels a little depressed due to Covid pandemic. Otherwise no complaints or medical concerns today.  HPI   Prior to Admission medications   Medication Sig Start Date End Date Taking? Authorizing Provider  enalapril-hydrochlorothiazide (VASERETIC) 10-25 MG tablet Take 1 tablet by mouth daily. 06/01/19  Yes Aidric Endicott, Ines Bloomer, MD  glipiZIDE (GLUCOTROL) 10 MG tablet Take 1 tablet (10 mg total) by mouth daily before breakfast. 03/02/19  Yes Savaughn Karwowski, Ines Bloomer, MD  insulin degludec (TRESIBA FLEXTOUCH) 100 UNIT/ML SOPN FlexTouch Pen Inject 0.25 mLs (25 Units total) into the skin at bedtime. 06/01/19  Yes Otis Burress, Ines Bloomer, MD  neomycin-polymyxin-hydrocortisone (CORTISPORIN) OTIC solution Apply 1-2 drops to toe after soaking twice a day 06/03/19  Yes Regal, Tamala Fothergill, DPM  atorvastatin (LIPITOR) 40 MG tablet Take 1 tablet (40 mg total) by mouth daily. 06/01/19 08/30/19  Horald Pollen, MD  metFORMIN (GLUCOPHAGE) 1000 MG tablet Take 1 tablet (1,000 mg total) by mouth 2 (two) times daily with a meal. 06/01/19 08/30/19  Horald Pollen, MD    No Known Allergies  Patient Active Problem List   Diagnosis  Date Noted  . Hypertension associated with type 2 diabetes mellitus ([place]) 12/18/2018  . Type 2 diabetes mellitus with hyperglycemia, without long-term current use of insulin ([place]) 12/18/2018    No past medical history on file.  No past surgical history on file.  Social History   Socioeconomic History  . Marital status: Married    Spouse name: Not on file  . Number of children: Not on file  . Years of education: Not on file  . Highest education level: Not on file  Occupational History  . Not on file  Tobacco Use  . Smoking status: Former Smoker    Quit date: 08/27/2010    Years since quitting: 9.0  . Smokeless tobacco: Never Used  Substance and Sexual Activity  . Alcohol use: Not Currently  . Drug use: Never  . Sexual activity: Not on file  Other Topics Concern  . Not on file  Social History Narrative  . Not on file   Social Determinants of Health   Financial Resource Strain:   . Difficulty of Paying Living Expenses: Not on file  Food Insecurity:   . Worried About Charity fundraiser in the Last Year: Not on file  . Ran Out of Food in the Last Year: Not on file  Transportation Needs:   . Lack of Transportation (Medical): Not on file  . Lack of Transportation (Non-Medical): Not on file  Physical Activity:   . Days of Exercise per Week: Not on file  . Minutes  of Exercise per Session: Not on file  Stress:   . Feeling of Stress : Not on file  Social Connections:   . Frequency of Communication with Friends and Family: Not on file  . Frequency of Social Gatherings with Friends and Family: Not on file  . Attends Religious Services: Not on file  . Active Member of Clubs or Organizations: Not on file  . Attends Archivist Meetings: Not on file  . Marital Status: Not on file  Intimate Partner Violence:   . Fear of Current or Ex-Partner: Not on file  . Emotionally Abused: Not on file  . Physically Abused: Not on file  . Sexually Abused: Not on file    No  family history on file.   Review of Systems  Constitutional: Negative.  Negative for chills and fever.  HENT: Negative.  Negative for congestion and sore throat.   Respiratory: Negative.  Negative for cough and shortness of breath.   Cardiovascular: Negative.  Negative for chest pain and palpitations.  Gastrointestinal: Negative.  Negative for abdominal pain, diarrhea, nausea and vomiting.  Genitourinary: Negative.   Musculoskeletal: Negative.  Negative for myalgias.  Skin: Negative.  Negative for rash.  Neurological: Negative.  Negative for dizziness and headaches.  Endo/Heme/Allergies: Negative.   All other systems reviewed and are negative.  Vitals:   09/01/19 0900  BP: 122/74  Pulse: 74  Resp: 16  Temp: 97.9 F (36.6 C)  SpO2: 96%     Physical Exam Vitals reviewed.  Constitutional:      Appearance: Normal appearance.  HENT:     Head: Normocephalic.  Eyes:     Extraocular Movements: Extraocular movements intact.     Pupils: Pupils are equal, round, and reactive to light.  Cardiovascular:     Rate and Rhythm: Normal rate and regular rhythm.     Pulses: Normal pulses.     Heart sounds: Normal heart sounds.  Pulmonary:     Effort: Pulmonary effort is normal.     Breath sounds: Normal breath sounds.  Abdominal:     General: There is no distension.     Palpations: Abdomen is soft.     Tenderness: There is no abdominal tenderness.  Musculoskeletal:        General: Normal range of motion.     Cervical back: Normal range of motion and neck supple.  Skin:    General: Skin is warm and dry.     Capillary Refill: Capillary refill takes less than 2 seconds.  Neurological:     General: No focal deficit present.     Mental Status: He is alert and oriented to person, place, and time.  Psychiatric:        Mood and Affect: Mood normal.        Behavior: Behavior normal.    A total of 40 minutes was spent in the room with the patient, greater than 50% of which was in  counseling/coordination of care regarding diabetes management including review of most recent blood work, change of medications and doses, hypoglycemic precautions, diet and nutrition, need to increase physical activity, cardiovascular risks associated with uncontrolled diabetes, hypertension and treatment, Covid 19 precautions, prognosis, and need for follow-up in 3 months.   ASSESSMENT & PLAN: Hypertension associated with type 2 diabetes mellitus ([place]) Well-controlled hypertension.  Continue present medication. Uncontrolled diabetes with hemoglobin A1c higher than before at 8.7.  Patient compliant with medications.  Continue metformin 1000 mg twice a day, glipizide 10 mg in  the morning with breakfast, Tresiba 25 units at bedtime.  Add Trulicity [id number] mg weekly.  Diet and nutrition discussed with patient.  Follow-up in 3 months.  Adeola was seen today for diabetes.  Diagnoses and all orders for this visit:  Hypertension associated with diabetes ([place]) -     POCT glucose (manual entry) -     POCT glycosylated hemoglobin (Hb A1C)  Type 2 diabetes mellitus with hyperglycemia, without long-term current use of insulin ([facility]) -     glipiZIDE (GLUCOTROL) 10 MG tablet; Take 1 tablet (10 mg total) by mouth daily before breakfast. -     Dulaglutide (TRULICITY) A999333 0000000 SOPN; Inject 0.75 mg into the skin once a week.  Need for prophylactic vaccination and inoculation against influenza -     Flu Vaccine QUAD 36+ mos IM  Hypertension associated with type 2 diabetes mellitus ([place])     Patient Instructions       If you have lab work done today you will be contacted with your lab results within the next 2 weeks.  If you have not heard from [place] then please contact us. The fastest way to get your results is to register for My Chart.   IF you received an x-ray today, you will receive an invoice from [facility]. Please contact [facility] at [phone] with questions or  concerns regarding your invoice.   IF you received labwork today, you will receive an invoice from [place]. Please contact [facility] at [phone] with questions or concerns regarding your invoice.   Our billing staff will not be able to assist you with questions regarding bills from these companies.  You will be contacted with the lab results as soon as they are available. The fastest way to get your results is to activate your My Chart account. Instructions are located on the last page of this paperwork. If you have not heard from [place] regarding the results in 2 weeks, please contact this office.     Diabetes mellitus y nutricin, en adultos Diabetes Mellitus and Nutrition, Adult Si sufre de diabetes (diabetes mellitus), es muy importante tener hbitos alimenticios saludables debido a que sus niveles de Designer, television/film set sangre (glucosa) se ven afectados en gran medida por lo que come y bebe. [place] alimentos saludables en las cantidades [place], aproximadamente a la [employer], [place] ayudar a:  Aeronautical engineer glucemia.  Disminuir el riesgo de sufrir una enfermedad cardaca.  Mejorar la presin arterial.  Science writer o mantener un peso saludable. Todas las personas que sufren de diabetes son diferentes y cada una tiene necesidades diferentes en cuanto a un plan de alimentacin. El mdico puede recomendarle que trabaje con un especialista en dietas y nutricin (nutricionista) para Financial trader plan para usted. Su plan de alimentacin puede variar segn factores como:  Las caloras que necesita.  Los medicamentos que toma.  Su peso.  Sus niveles de glucemia, presin arterial y colesterol.  Su nivel de [place].  Otras afecciones que tenga, como enfermedades cardacas o renales. Cmo me afectan los carbohidratos? Los carbohidratos, o hidratos de carbono, afectan su nivel de glucemia ms que cualquier otro tipo de alimento. La ingesta de carbohidratos naturalmente aumenta  la cantidad de [employer]. El recuento de carbohidratos es un mtodo destinado a Catering manager un registro de la cantidad de carbohidratos que se consumen. El recuento de carbohidratos es importante para Theatre manager la glucemia a un nivel saludable, especialmente si utiliza insulina o toma determinados medicamentos  por va oral para la diabetes. Es importante conocer la cantidad de carbohidratos que se pueden ingerir en cada comida sin correr Engineer, manufacturing. Esto es Psychologist, forensic. Su nutricionista puede ayudarlo a calcular la cantidad de carbohidratos que debe ingerir en cada comida y en cada refrigerio. Entre los alimentos que contienen carbohidratos, se incluyen:  Pan, cereal, arroz, pastas y galletas.  Papas y maz.  Guisantes, frijoles y lentejas.  Leche y Estate agent.  Lambert Mody y [place].  Postres, como pasteles, galletas, helado y caramelos. Cmo me afecta el alcohol? El alcohol puede provocar disminuciones sbitas de la glucemia (hipoglucemia), especialmente si utiliza insulina o toma determinados medicamentos por va oral para la diabetes. La hipoglucemia es una afeccin potencialmente mortal. Los sntomas de la hipoglucemia (somnolencia, mareos y confusin) son similares a los sntomas de haber consumido demasiado alcohol. Si el mdico afirma que el alcohol es seguro para usted, [place] estas pautas:  Limite el consumo de alcohol a no ms de 74medida por da si es mujer y no est [place], y a 50medidas si es hombre. Una medida equivale a 12oz (331ml) de cerveza, 5oz (167ml) de vino o 1oz (44ml) de bebidas alcohlicas de alta graduacin.  No beba con el estmago vaco.  Mantngase hidratado bebiendo agua, refrescos dietticos o t helado sin azcar.  Tenga en cuenta que los refrescos comunes, los jugos y otras bebida para Optician, dispensing pueden contener mucha azcar y se deben contar como carbohidratos. Cules son algunos consejos para seguir este plan?  Leer las etiquetas de los  alimentos  Comience por leer el tamao de la porcin en la "Informacin nutricional" en las etiquetas de los alimentos envasados y las bebidas. La cantidad de caloras, carbohidratos, grasas y otros nutrientes mencionados en la etiqueta se basan en una porcin del alimento. Muchos alimentos contienen ms de una porcin por envase.  Verifique la cantidad total de gramos (g) de carbohidratos totales en una porcin. Puede calcular la cantidad de porciones de carbohidratos al dividir el total de carbohidratos por 15. Por ejemplo, si un alimento tiene un total de 30g de carbohidratos, equivale a 2 porciones de carbohidratos.  Verifique la cantidad de gramos (g) de grasas saturadas y grasas trans en una porcin. Escoja alimentos que no contengan grasa o que tengan un bajo contenido.  Verifique la cantidad de miligramos (mg) de sal (sodio) en una porcin. La [employer] de las personas deben limitar la ingesta de sodio total a menos de 2300mg  por Training and development officer.  Siempre consulte la informacin nutricional de los alimentos etiquetados como "con bajo contenido de grasa" o "sin grasa". Estos alimentos pueden tener un mayor contenido de Location manager agregada o carbohidratos refinados, y deben evitarse.  Hable con su nutricionista para identificar sus objetivos diarios en cuanto a los nutrientes mencionados en la etiqueta. Al ir de compras  Evite comprar alimentos procesados, enlatados o precocinados. Estos alimentos tienden a Special educational needs teacher mayor cantidad de [place], sodio y azcar agregada.  Compre en la zona exterior de la tienda de comestibles. Esta zona incluye frutas y verduras frescas, granos a granel, carnes frescas y productos lcteos frescos. Al cocinar  Utilice mtodos de coccin a baja temperatura, como hornear, en lugar de mtodos de coccin a alta temperatura, como frer en abundante aceite.  Cocine con aceites saludables, como el aceite de [place], canola o [place].  Evite cocinar con manteca, crema o carnes con  alto contenido de grasa. Planificacin de las comidas  Coma las comidas y los refrigerios regularmente, preferentemente a la  misma [employer]. Evite pasar largos perodos de tiempo sin comer.  Consuma alimentos ricos en fibra, como frutas frescas, verduras, frijoles y cereales integrales. Consulte a su nutricionista sobre cuntas porciones de carbohidratos puede consumir en cada comida.  Consuma entre 4 y 6 onzas (oz) de protenas magras por da, como carnes [place], pollo, pescado, huevos o tofu. Una onza de protena magra equivale a: ? 1 onza de carne, pollo o pescado. ? 1huevo. ?  taza de tofu.  Coma algunos alimentos por da que contengan grasas saludables, como aguacates, frutos secos, semillas y pescado. Estilo de vida  Controle su nivel de glucemia con regularidad.  Haga actividad fsica habitualmente como se lo haya indicado el mdico. Esto puede incluir lo siguiente: ? 135minutos semanales de ejercicio de intensidad moderada o alta. Esto podra incluir caminatas dinmicas, ciclismo o gimnasia acutica. ? Realizar ejercicios de elongacin y de fortalecimiento, como yoga o levantamiento de pesas, por lo menos 2veces por semana.  Tome los [employer] se lo haya indicado el mdico.  No consuma ningn producto que contenga nicotina o tabaco, como cigarrillos y Psychologist, sport and exercise. Si necesita ayuda para dejar de fumar, consulte al [employer] con un asesor o instructor en diabetes para identificar estrategias para controlar el estrs y cualquier desafo emocional y social. Preguntas para hacerle al mdico  Es necesario que consulte a Radio broadcast assistant en el cuidado de la diabetes?  Es necesario que me rena con un nutricionista?  A qu nmero puedo llamar si tengo preguntas?  Cules son los mejores momentos para controlar la glucemia? Dnde encontrar ms informacin:  Asociacin Estadounidense de la Diabetes (American Diabetes Association):  diabetes.org  Academia de Nutricin y Information systems manager (Academy of Nutrition and Dietetics): www.eatright.[place] Diabetes y las Enfermedades Digestivas y [facility] of Diabetes and Digestive and Kidney Diseases, NIH): [employer]eral.dk Resumen  Un plan de alimentacin saludable lo ayudar a Aeronautical engineer glucemia y Theatre manager un estilo de vida saludable.  Trabajar con un especialista en dietas y nutricin (nutricionista) puede ayudarlo a Insurance claims handler de alimentacin para usted.  Tenga en cuenta que los carbohidratos (hidratos de carbono) y el alcohol tienen efectos inmediatos en sus niveles de glucemia. Es importante contar los carbohidratos que ingiere y consumir alcohol con prudencia. Esta informacin no tiene Marine scientist el consejo del mdico. Asegrese de hacerle al mdico cualquier pregunta que tenga. Document Revised: 04/23/2017 Document Reviewed: 12/03/2016 Elsevier Patient Education  2020 Elsevier Inc.      Agustina Caroli, MD [facility]

## 2019-09-25 ENCOUNTER — Ambulatory Visit: Payer: Medicare HMO | Attending: Internal Medicine

## 2019-09-25 DIAGNOSIS — Z20822 Contact with and (suspected) exposure to covid-19: Secondary | ICD-10-CM | POA: Insufficient documentation

## 2019-09-26 LAB — NOVEL CORONAVIRUS, NAA: SARS-CoV-2, NAA: NOT DETECTED

## 2019-10-19 ENCOUNTER — Other Ambulatory Visit: Payer: Self-pay | Admitting: Emergency Medicine

## 2019-10-19 DIAGNOSIS — E1165 Type 2 diabetes mellitus with hyperglycemia: Secondary | ICD-10-CM

## 2019-10-19 MED ORDER — TRESIBA FLEXTOUCH 100 UNIT/ML ~~LOC~~ SOPN: 25.0000 [IU] | PEN_INJECTOR | Freq: Every day | SUBCUTANEOUS | 11 refills | Status: DC

## 2019-10-19 NOTE — Telephone Encounter (Signed)
Medication: insulin degludec (TRESIBA FLEXTOUCH) 100 UNIT/ML SOPN FlexTouch Pen [id number]   Has the patient contacted their pharmacy? Yes  (Agent: If no, request that the patient contact the pharmacy for the refill.) (Agent: If yes, when and what did the pharmacy advise?)  Preferred Pharmacy (with phone number or street name): [facility], [place] to [address]istered [place] Sites  Phone:  [phone] Fax:  [phone]     Agent: Please be advised that RX refills may take up to 3 business days. We ask that you follow-up with your pharmacy.

## 2019-10-19 NOTE — Telephone Encounter (Signed)
Requested Prescriptions  Pending Prescriptions Disp Refills  . insulin degludec (TRESIBA FLEXTOUCH) 100 UNIT/ML SOPN FlexTouch Pen 5 pen 11    Sig: Inject 0.25 mLs (25 Units total) into the skin at bedtime.     Endocrinology:  Diabetes - Insulins Failed - 10/19/2019  1:31 PM      Failed - HBA1C is between 0 and 7.9 and within 180 days    Hemoglobin A1C  Date Value Ref Range Status  09/01/2019 8.7 (A) 4.0 - 5.6 % Final   Hgb A1c MFr Bld  Date Value Ref Range Status  03/02/2019 10.1 (H) 4.8 - 5.6 % Final    Comment:             Prediabetes: 5.7 - 6.4          Diabetes: >6.4          Glycemic control for adults with diabetes: <7.0          Passed - Valid encounter within last 6 months    Recent Outpatient Visits          1 month ago Hypertension associated with diabetes [facility])   Primary Care at [place], [place], MD   4 months ago Hypertension associated with diabetes [facility])   Primary Care at [place], [place], MD   7 months ago Type 2 diabetes mellitus with hyperglycemia, without long-term current use of insulin [facility])   Primary Care at [facility], [place], MD   10 months ago Hypertension associated with type 2 diabetes mellitus [facility])   Primary Care at [facility], Ines Bloomer, MD      Future Appointments            In 1 month [place], Ines Bloomer, MD Primary Care at [place]

## 2019-11-17 ENCOUNTER — Ambulatory Visit (INDEPENDENT_AMBULATORY_CARE_PROVIDER_SITE_OTHER): Payer: Medicare HMO | Admitting: Emergency Medicine

## 2019-11-17 ENCOUNTER — Ambulatory Visit (INDEPENDENT_AMBULATORY_CARE_PROVIDER_SITE_OTHER): Payer: Medicare HMO

## 2019-11-17 ENCOUNTER — Other Ambulatory Visit: Payer: Self-pay

## 2019-11-17 ENCOUNTER — Encounter: Payer: Self-pay | Admitting: Emergency Medicine

## 2019-11-17 VITALS — BP 112/75 | HR 87 | Temp 77.6°F | Resp 16 | Ht 64.0 in | Wt 192.0 lb

## 2019-11-17 DIAGNOSIS — E1159 Type 2 diabetes mellitus with other circulatory complications: Secondary | ICD-10-CM | POA: Diagnosis not present

## 2019-11-17 DIAGNOSIS — I10 Essential (primary) hypertension: Secondary | ICD-10-CM | POA: Diagnosis not present

## 2019-11-17 DIAGNOSIS — R221 Localized swelling, mass and lump, neck: Secondary | ICD-10-CM

## 2019-11-17 DIAGNOSIS — I152 Hypertension secondary to endocrine disorders: Secondary | ICD-10-CM

## 2019-11-17 NOTE — Progress Notes (Signed)
Damon Dudley 59 y.o.   Chief Complaint  Patient presents with  . Mass    on LEFT side of neck for 6 months and getting larger    HISTORY OF PRESENT ILLNESS: This is a 59 y.o. male complaining of left-sided neck mass slowly getting larger over the past 6 months. No significant tenderness or pain and no associated symptoms. Ex smoker.  Able to eat and drink okay. Denies nausea or vomiting.  No weight loss or decreased appetite. Diabetic and hypertensive.  HPI   Prior to Admission medications   Medication Sig Start Date End Date Taking? Authorizing Provider  Dulaglutide (TRULICITY) A999333 [id number] SOPN Inject 0.75 mg into the skin once a week. 09/01/19 11/30/19 Yes Reya Aurich, Ines Bloomer, MD  enalapril-hydrochlorothiazide (VASERETIC) 10-25 MG tablet Take 1 tablet by mouth daily. 06/01/19  Yes Keagon Glascoe, Ines Bloomer, MD  glipiZIDE (GLUCOTROL) 10 MG tablet Take 1 tablet (10 mg total) by mouth daily before breakfast. 09/01/19  Yes Jearldine Cassady, Ines Bloomer, MD  insulin degludec (TRESIBA FLEXTOUCH) 100 UNIT/ML SOPN FlexTouch Pen Inject 0.25 mLs (25 Units total) into the skin at bedtime. 10/19/19  Yes Maleiya Pergola, Ines Bloomer, MD  atorvastatin (LIPITOR) 40 MG tablet Take 1 tablet (40 mg total) by mouth daily. 06/01/19 08/30/19  Horald Pollen, MD  metFORMIN (GLUCOPHAGE) 1000 MG tablet Take 1 tablet (1,000 mg total) by mouth 2 (two) times daily with a meal. 06/01/19 08/30/19  Vegas Coffin, Ines Bloomer, MD  neomycin-polymyxin-hydrocortisone (CORTISPORIN) OTIC solution Apply 1-2 drops to toe after soaking twice a day Patient not taking: Reported on 11/17/2019 06/03/19   Wallene Huh, DPM    No Known Allergies  Patient Active Problem List   Diagnosis Date Noted  . Hypertension associated with type 2 diabetes mellitus ([place]) 12/18/2018  . Type 2 diabetes mellitus with hyperglycemia, without long-term current use of insulin ([place]) 12/18/2018    No past medical history on file.  No past surgical history on  file.  Social History   Socioeconomic History  . Marital status: Married    Spouse name: Not on file  . Number of children: Not on file  . Years of education: Not on file  . Highest education level: Not on file  Occupational History  . Not on file  Tobacco Use  . Smoking status: Former Smoker    Quit date: 08/27/2010    Years since quitting: 9.2  . Smokeless tobacco: Never Used  Substance and Sexual Activity  . Alcohol use: Not Currently  . Drug use: Never  . Sexual activity: Not on file  Other Topics Concern  . Not on file  Social History Narrative  . Not on file   Social Determinants of Health   Financial Resource Strain:   . Difficulty of Paying Living Expenses:   Food Insecurity:   . Worried About Charity fundraiser in the Last Year:   . Arboriculturist in the Last Year:   Transportation Needs:   . Film/video editor (Medical):   Marland Kitchen Lack of Transportation (Non-Medical):   Physical Activity:   . Days of Exercise per Week:   . Minutes of Exercise per Session:   Stress:   . Feeling of Stress :   Social Connections:   . Frequency of Communication with Friends and Family:   . Frequency of Social Gatherings with Friends and Family:   . Attends Religious Services:   . Active Member of Clubs or Organizations:   . Attends Archivist  Meetings:   Marland Kitchen Marital Status:   Intimate Partner Violence:   . Fear of Current or Ex-Partner:   . Emotionally Abused:   Marland Kitchen Physically Abused:   . Sexually Abused:     No family history on file.   Review of Systems  Constitutional: Negative.  Negative for chills, fever and weight loss.  HENT: Negative.  Negative for congestion, ear pain, sinus pain and sore throat.   Respiratory: Positive for cough. Negative for shortness of breath.   Cardiovascular: Negative.  Negative for chest pain and palpitations.  Gastrointestinal: Negative.  Negative for abdominal pain, diarrhea, nausea and vomiting.  Genitourinary: Negative.   Negative for dysuria and hematuria.  Musculoskeletal: Negative for back pain and myalgias.  Skin: Negative.  Negative for rash.  Neurological: Negative.  Negative for dizziness and headaches.  All other systems reviewed and are negative.  Vitals:   11/17/19 1543  BP: 112/75  Pulse: 87  Resp: 16  Temp: (!) 77.6 F (25.3 C)  SpO2: 96%     Physical Exam Vitals reviewed.  Constitutional:      Appearance: Normal appearance.  HENT:     Head: Normocephalic.     Mouth/Throat:     Mouth: Mucous membranes are moist.     Pharynx: Oropharynx is clear.  Eyes:     Extraocular Movements: Extraocular movements intact.     Conjunctiva/sclera: Conjunctivae normal.     Pupils: Pupils are equal, round, and reactive to light.  Cardiovascular:     Rate and Rhythm: Normal rate and regular rhythm.     Pulses: Normal pulses.     Heart sounds: Normal heart sounds.  Pulmonary:     Effort: Pulmonary effort is normal.     Breath sounds: Normal breath sounds.  Musculoskeletal:     Cervical back: Normal range of motion and neck supple. No tenderness.  Skin:    Capillary Refill: Capillary refill takes less than 2 seconds.     Comments: Hard, nontender, fixed mass to left side of neck.  No fluctuation or erythema.  Neurological:     General: No focal deficit present.     Mental Status: He is alert and oriented to person, place, and time.  Psychiatric:        Mood and Affect: Mood normal.        Behavior: Behavior normal.         DG Chest 2 View  Result Date: 11/17/2019 CLINICAL DATA:  Left neck mass. EXAM: CHEST - 2 VIEW COMPARISON:  None. FINDINGS: The heart size and mediastinal contours are within normal limits. Both lungs are clear. The visualized skeletal structures are unremarkable. IMPRESSION: No active cardiopulmonary disease. Electronically Signed   By: Marijo Conception M.D.   On: 11/17/2019 16:25   A total of 30 minutes was spent with the patient, greater than 50% of which was in  counseling/coordination of care regarding differential diagnosis of mass on his left neck, need for diagnostic work-up including CT scan and blood work, prognosis and need for follow-up after scan is done.   ASSESSMENT & PLAN: Tajon was seen today for mass.  Diagnoses and all orders for this visit:  Mass of left side of neck -     DG Chest 2 View -     CBC with Differential/Platelet -     Comprehensive metabolic panel -     CT Soft Tissue Neck W Contrast; Future  Hypertension associated with diabetes [facility])    Patient Instructions  Schedule CT scan of neck this week. Contact the office if any problems.   Mantenimiento de Teacher, English as a foreign language, Male Adoptar un estilo de vida saludable y recibir atencin preventiva son importantes para promover la salud y Musician. Consulte al mdico sobre:  El esquema adecuado para hacerse pruebas y exmenes peridicos.  Cosas que puede hacer por su cuenta para prevenir enfermedades y [place] sano. Qu debo saber sobre la dieta, el peso y el ejercicio? Consuma una dieta saludable   Consuma una dieta que incluya muchas verduras, frutas, productos lcteos con bajo contenido de [place] y Advertising account planner.  No consuma muchos alimentos ricos en grasas slidas, azcares agregados o sodio. Mantenga un peso saludable El ndice de masa muscular [facility]) es una medida que puede utilizarse para identificar posibles problemas de [place]. Proporciona una estimacin de la grasa corporal basndose en el peso y la altura. Su mdico puede ayudarle a Radiation protection practitioner [place] y a Scientist, forensic o Theatre manager un peso saludable. Haga ejercicio con regularidad Haga ejercicio con regularidad. Esta es una de las prcticas ms importantes que puede hacer por su salud. La mayora de los adultos deben seguir estas pautas:  Optometrist, al menos, 155minutos de actividad fsica por semana. El ejercicio debe aumentar la frecuencia cardaca y Nature conservation officer transpirar  (ejercicio de intensidad moderada).  Hacer ejercicios de fortalecimiento por lo [employer] por semana. Agregue esto a su plan de ejercicio de intensidad moderada.  Pasar menos tiempo sentados. Incluso la actividad fsica ligera puede ser beneficiosa. Controle sus niveles de colesterol y lpidos en la sangre Comience a realizarse anlisis de lpidos y Research officer, trade union en la sangre a los 20aos y luego reptalos cada 5aos. Es posible que Automotive engineer los niveles de colesterol con mayor frecuencia si:  Sus niveles de lpidos y colesterol son altos.  Es mayor de 40aos.  Presenta un alto riesgo de padecer enfermedades cardacas. Qu debo saber sobre las pruebas de deteccin del cncer? Muchos tipos de cncer pueden detectarse de manera temprana y, a menudo, pueden prevenirse. Segn su historia clnica y sus antecedentes familiares, es posible que deba realizarse pruebas de deteccin del cncer en diferentes edades. Esto puede incluir pruebas de deteccin de lo siguiente:  Surveyor, minerals.  Cncer de prstata.  Cncer de piel.  Cncer de pulmn. Qu debo saber sobre la enfermedad cardaca, la diabetes y la hipertensin arterial? Presin arterial y enfermedad cardaca  La hipertensin arterial causa enfermedades cardacas y [place] el riesgo de accidente cerebrovascular. Es ms probable que esto se manifieste en las personas que tienen lecturas de presin arterial alta, tienen ascendencia africana o tienen sobrepeso.  Hable con el mdico sobre sus valores de presin arterial deseados.  Hgase controlar la presin arterial: ? Cada 3 a 5 aos si tiene entre 18 y 32 aos. ? Todos los aos si es mayor de [place].  Si tiene entre 55 y 46 aos y es fumador o Insurance account manager, pregntele al mdico si debe realizarse una prueba de deteccin de aneurisma artico abdominal (AAA) por nica vez. Diabetes Realcese exmenes de deteccin de la diabetes con regularidad. Este anlisis revisa  el nivel de azcar en la sangre en [place]. Hgase las pruebas de deteccin:  Cada tresaos despus de los 36aos de edad si tiene un peso normal y un bajo riesgo de padecer diabetes.  Con ms frecuencia y a partir de [place] edad inferior si tiene sobrepeso o un alto riesgo de padecer diabetes. Qu debo saber sobre la prevencin  de infecciones? Hepatitis B Si tiene un riesgo ms alto de contraer hepatitis B, debe someterse a un examen de deteccin de este virus. Hable con el mdico para averiguar si tiene riesgo de contraer la infeccin por hepatitis B. Hepatitis C Se recomienda un anlisis de [place] para:  Todos los que nacieron entre 1945 y [phone].  Todas las personas que tengan un riesgo de haber contrado hepatitis C. Enfermedades de transmisin sexual (ETS)  Debe realizarse pruebas de deteccin de ITS todos los aos, incluidas la gonorrea y la clamidia, si: ? Es sexualmente activo y es menor de 24aos. ? Es mayor de 24aos, y Investment banker, operational informa que corre riesgo de tener este tipo de infecciones. ? La actividad sexual ha cambiado desde que le hicieron la ltima prueba de deteccin y tiene un riesgo mayor de Best boy clamidia o Radio broadcast assistant. Pregntele al mdico si usted tiene riesgo.  Pregntele al mdico si usted tiene un alto riesgo de Museum/gallery curator VIH. El mdico tambin puede recomendarle un medicamento recetado para ayudar a evitar la infeccin por el VIH. Si elige tomar medicamentos para prevenir el VIH, primero debe [employer] de deteccin del VIH. Luego debe hacerse anlisis cada 6meses mientras est tomando los medicamentos. Siga estas instrucciones en su casa: Estilo de vida  No consuma ningn producto que contenga nicotina o tabaco, como cigarrillos, cigarrillos electrnicos y tabaco de Higher education careers adviser. Si necesita ayuda para dejar de fumar, consulte al mdico.  No consuma drogas.  No comparta agujas.  Solicite ayuda a su mdico si necesita apoyo o informacin para abandonar las  drogas. Consumo de alcohol  No beba alcohol si el mdico se lo prohbe.  Si bebe alcohol: ? Limite la cantidad que consume de 0 a 2 medidas por da. ? Est atento a la cantidad de alcohol que hay en las bebidas que toma. En los [place], una medida equivale a una botella de cerveza de 12oz (364ml), un vaso de vino de 5oz (173ml) o un vaso de una bebida alcohlica de alta graduacin de 1oz (69ml). Instrucciones generales  Realcese los estudios de rutina de la salud, dentales y de Public librarian.  [place].  Infrmele a su mdico si: ? Se siente deprimido con frecuencia. ? Alguna vez ha sido vctima de [place] o no se siente seguro en su casa. Resumen  Adoptar un estilo de vida saludable y recibir atencin preventiva son importantes para promover la salud y Musician.  Siga las instrucciones del mdico acerca de una dieta saludable, el ejercicio y la realizacin de pruebas o exmenes para Engineer, building services.  Siga las instrucciones del mdico con respecto al control del colesterol y la presin arterial. Esta informacin no tiene Marine scientist el consejo del mdico. Asegrese de hacerle al mdico cualquier pregunta que tenga. Document Revised: 09/03/2018 Document Reviewed: 09/03/2018 Elsevier Patient Education  [employer].     If you have lab work done today you will be contacted with your lab results within the next 2 weeks.  If you have not heard from [place] then please contact us. The fastest way to get your results is to register for My Chart.   IF you received an x-ray today, you will receive an invoice from [facility]. Please contact [facility] Radiology at [phone] with questions or concerns regarding your invoice.   IF you received labwork today, you will receive an invoice from [place]. Please contact [facility] at [phone] with questions or concerns regarding your  invoice.   Our billing staff will not be  able to assist you with questions regarding bills from these companies.  You will be contacted with the lab results as soon as they are available. The fastest way to get your results is to activate your My Chart account. Instructions are located on the last page of this paperwork. If you have not heard from [place] regarding the results in 2 weeks, please contact this office.    Place head/neck mass patient instructions here.      Agustina Caroli, MD [facility]

## 2019-11-17 NOTE — Patient Instructions (Addendum)
Schedule CT scan of neck this week. Contact the office if any problems.   Mantenimiento de Teacher, English as a foreign language, Male Adoptar un estilo de vida saludable y recibir atencin preventiva son importantes para promover la salud y Musician. Consulte al mdico sobre:  El esquema adecuado para hacerse pruebas y exmenes peridicos.  Cosas que puede hacer por su cuenta para prevenir enfermedades y [place] sano. Qu debo saber sobre la dieta, el peso y el ejercicio? Consuma una dieta saludable   Consuma una dieta que incluya muchas verduras, frutas, productos lcteos con bajo contenido de [place] y Advertising account planner.  No consuma muchos alimentos ricos en grasas slidas, azcares agregados o sodio. Mantenga un peso saludable El ndice de masa muscular [facility] es una medida que puede utilizarse para identificar posibles problemas de [place]. Proporciona una estimacin de la grasa corporal basndose en el peso y la altura. Su mdico puede ayudarle a Radiation protection practitioner [place] y a Scientist, forensic o Theatre manager un peso saludable. Haga ejercicio con regularidad Haga ejercicio con regularidad. Esta es una de las prcticas ms importantes que puede hacer por su salud. La mayora de los adultos deben seguir estas pautas:  Optometrist, al menos, 177minutos de actividad fsica por semana. El ejercicio debe aumentar la frecuencia cardaca y Nature conservation officer transpirar (ejercicio de intensidad moderada).  Hacer ejercicios de fortalecimiento por lo [employer] por semana. Agregue esto a su plan de ejercicio de intensidad moderada.  Pasar menos tiempo sentados. Incluso la actividad fsica ligera puede ser beneficiosa. Controle sus niveles de colesterol y lpidos en la sangre Comience a realizarse anlisis de lpidos y Research officer, trade union en la sangre a los 20aos y luego reptalos cada 5aos. Es posible que Automotive engineer los niveles de colesterol con mayor frecuencia si:  Sus niveles de lpidos y colesterol son  altos.  Es mayor de 40aos.  Presenta un alto riesgo de padecer enfermedades cardacas. Qu debo saber sobre las pruebas de deteccin del cncer? Muchos tipos de cncer pueden detectarse de manera temprana y, a menudo, pueden prevenirse. Segn su historia clnica y sus antecedentes familiares, es posible que deba realizarse pruebas de deteccin del cncer en diferentes edades. Esto puede incluir pruebas de deteccin de lo siguiente:  Surveyor, minerals.  Cncer de prstata.  Cncer de piel.  Cncer de pulmn. Qu debo saber sobre la enfermedad cardaca, la diabetes y la hipertensin arterial? Presin arterial y enfermedad cardaca  La hipertensin arterial causa enfermedades cardacas y [place] el riesgo de accidente cerebrovascular. Es ms probable que esto se manifieste en las personas que tienen lecturas de presin arterial alta, tienen ascendencia africana o tienen sobrepeso.  Hable con el mdico sobre sus valores de presin arterial deseados.  Hgase controlar la presin arterial: ? Cada 3 a 5 aos si tiene entre 18 y 77 aos. ? Todos los aos si es mayor de [place].  Si tiene entre 73 y 73 aos y es fumador o Insurance account manager, pregntele al mdico si debe realizarse una prueba de deteccin de aneurisma artico abdominal (AAA) por nica vez. Diabetes Realcese exmenes de deteccin de la diabetes con regularidad. Este anlisis revisa el nivel de azcar en la sangre en [place]. Hgase las pruebas de deteccin:  Cada tresaos despus de los 64aos de edad si tiene un peso normal y un bajo riesgo de padecer diabetes.  Con ms frecuencia y a partir de [place] edad inferior si tiene sobrepeso o un alto riesgo de padecer diabetes. Qu debo saber sobre la prevencin de  infecciones? Hepatitis B Si tiene un riesgo ms alto de contraer hepatitis B, debe someterse a un examen de deteccin de este virus. Hable con el mdico para averiguar si tiene riesgo de contraer la infeccin por hepatitis  B. Hepatitis C Se recomienda un anlisis de [place] para:  Todos los que nacieron entre 1945 y [phone].  Todas las personas que tengan un riesgo de haber contrado hepatitis C. Enfermedades de transmisin sexual (ETS)  Debe realizarse pruebas de deteccin de ITS todos los aos, incluidas la gonorrea y la clamidia, si: ? Es sexualmente activo y es menor de 24aos. ? Es mayor de 24aos, y Investment banker, operational informa que corre riesgo de tener este tipo de infecciones. ? La actividad sexual ha cambiado desde que le hicieron la ltima prueba de deteccin y tiene un riesgo mayor de Best boy clamidia o Radio broadcast assistant. Pregntele al mdico si usted tiene riesgo.  Pregntele al mdico si usted tiene un alto riesgo de Museum/gallery curator VIH. El mdico tambin puede recomendarle un medicamento recetado para ayudar a evitar la infeccin por el VIH. Si elige tomar medicamentos para prevenir el VIH, primero debe [employer] de deteccin del VIH. Luego debe hacerse anlisis cada 54meses mientras est tomando los medicamentos. Siga estas instrucciones en su casa: Estilo de vida  No consuma ningn producto que contenga nicotina o tabaco, como cigarrillos, cigarrillos electrnicos y tabaco de Higher education careers adviser. Si necesita ayuda para dejar de fumar, consulte al mdico.  No consuma drogas.  No comparta agujas.  Solicite ayuda a su mdico si necesita apoyo o informacin para abandonar las drogas. Consumo de alcohol  No beba alcohol si el mdico se lo prohbe.  Si bebe alcohol: ? Limite la cantidad que consume de 0 a 2 medidas por da. ? Est atento a la cantidad de alcohol que hay en las bebidas que toma. En los [place], una medida equivale a una botella de cerveza de 12oz (359ml), un vaso de vino de 5oz (123ml) o un vaso de una bebida alcohlica de alta graduacin de 1oz (107ml). Instrucciones generales  Realcese los estudios de rutina de la salud, dentales y de Public librarian.  [place].  Infrmele a su mdico si: ? Se siente deprimido con frecuencia. ? Alguna vez ha sido vctima de [place] o no se siente seguro en su casa. Resumen  Adoptar un estilo de vida saludable y recibir atencin preventiva son importantes para promover la salud y Musician.  Siga las instrucciones del mdico acerca de una dieta saludable, el ejercicio y la realizacin de pruebas o exmenes para Engineer, building services.  Siga las instrucciones del mdico con respecto al control del colesterol y la presin arterial. Esta informacin no tiene Marine scientist el consejo del mdico. Asegrese de hacerle al mdico cualquier pregunta que tenga. Document Revised: 09/03/2018 Document Reviewed: 09/03/2018 Elsevier Patient Education  [employer].     If you have lab work done today you will be contacted with your lab results within the next 2 weeks.  If you have not heard from [place] then please contact us. The fastest way to get your results is to register for My Chart.   IF you received an x-ray today, you will receive an invoice from [facility] Rad[facility]. Please contact [facility]iology at [phone] with questions or concerns regarding your invoice.   IF you received labwork today, you will receive an invoice from [place]. Please contact [facility] at [phone] with questions or concerns regarding your  invoice.   Our billing staff will not be able to assist you with questions regarding bills from these companies.  You will be contacted with the lab results as soon as they are available. The fastest way to get your results is to activate your My Chart account. Instructions are located on the last page of this paperwork. If you have not heard from [place] regarding the results in 2 weeks, please contact this office.    Place head/neck mass patient instructions here.

## 2019-11-18 LAB — COMPREHENSIVE METABOLIC PANEL
ALT: 29 IU/L (ref 0–44)
AST: 22 IU/L (ref 0–40)
Albumin/Globulin Ratio: 2.8 — ABNORMAL HIGH (ref 1.2–2.2)
Albumin: 4.7 g/dL (ref 3.8–4.9)
Alkaline Phosphatase: 99 IU/L (ref 39–117)
BUN/Creatinine Ratio: 13 (ref 10–24)
BUN: 15 mg/dL (ref 8–27)
Bilirubin Total: 0.2 mg/dL (ref 0.0–1.2)
CO2: 27 mmol/L (ref 20–29)
Calcium: 9.8 mg/dL (ref 8.6–10.2)
Chloride: 100 mmol/L (ref 96–106)
Creatinine, Ser: 1.18 mg/dL (ref 0.76–1.27)
GFR calc Af Amer: 77 mL/min/{1.73_m2} (ref >59)
GFR calc non Af Amer: 67 mL/min/{1.73_m2} (ref >59)
Globulin, Total: 1.7 g/dL (ref 1.5–4.5)
Glucose: 234 mg/dL — ABNORMAL HIGH (ref 65–99)
Potassium: 4.4 mmol/L (ref 3.5–5.2)
Sodium: 142 mmol/L (ref 134–144)
Total Protein: 6.4 g/dL (ref 6.0–8.5)

## 2019-11-18 LAB — CBC WITH DIFFERENTIAL/PLATELET
Basophils Absolute: 0.1 10*3/uL (ref 0.0–0.2)
Basos: 1 %
EOS (ABSOLUTE): 0.2 10*3/uL (ref 0.0–0.4)
Eos: 2 %
Hematocrit: 41.6 % (ref 37.5–51.0)
Hemoglobin: 14.2 g/dL (ref 13.0–17.7)
Immature Grans (Abs): 0 10*3/uL (ref 0.0–0.1)
Immature Granulocytes: 0 %
Lymphocytes Absolute: 2.3 10*3/uL (ref 0.7–3.1)
Lymphs: 32 %
MCH: 29.7 pg (ref 26.6–33.0)
MCHC: 34.1 g/dL (ref 31.5–35.7)
MCV: 87 fL (ref 79–97)
Monocytes Absolute: 0.4 10*3/uL (ref 0.1–0.9)
Monocytes: 6 %
Neutrophils Absolute: 4.1 10*3/uL (ref 1.4–7.0)
Neutrophils: 59 %
Platelets: 345 10*3/uL (ref 150–450)
RBC: 4.78 x10E6/uL (ref 4.14–5.80)
RDW: 12.6 % (ref 11.6–15.4)
WBC: 7.1 10*3/uL (ref 3.4–10.8)

## 2019-11-26 ENCOUNTER — Other Ambulatory Visit: Payer: Self-pay

## 2019-11-26 ENCOUNTER — Ambulatory Visit (HOSPITAL_COMMUNITY)
Admission: RE | Admit: 2019-11-26 | Discharge: 2019-11-26 | Disposition: A | Discharge status: Home / Self Care | Payer: Medicare HMO | Source: Ambulatory Visit | Attending: Emergency Medicine | Admitting: Emergency Medicine

## 2019-11-26 ENCOUNTER — Encounter (HOSPITAL_COMMUNITY): Payer: Self-pay

## 2019-11-26 DIAGNOSIS — R221 Localized swelling, mass and lump, neck: Secondary | ICD-10-CM

## 2019-11-26 HISTORY — DX: Type 2 diabetes mellitus without complications: E11.9

## 2019-11-26 HISTORY — DX: Essential (primary) hypertension: I10

## 2019-11-26 MED ORDER — IOHEXOL 300 MG/ML  SOLN
75.0000 mL | Freq: Once | INTRAMUSCULAR | Status: AC | PRN
  Administered 2019-11-26: 16:00:00 75 mL via INTRAVENOUS

## 2019-11-26 MED ORDER — SODIUM CHLORIDE (PF) 0.9 % IJ SOLN
INTRAMUSCULAR | Status: AC
  Filled 2019-11-26: qty 50

## 2019-11-27 ENCOUNTER — Other Ambulatory Visit: Payer: Self-pay | Admitting: Emergency Medicine

## 2019-11-27 ENCOUNTER — Telehealth: Payer: Self-pay | Admitting: Emergency Medicine

## 2019-11-27 DIAGNOSIS — C8591 Non-Hodgkin lymphoma, unspecified, lymph nodes of head, face, and neck: Secondary | ICD-10-CM

## 2019-11-27 DIAGNOSIS — R590 Localized enlarged lymph nodes: Secondary | ICD-10-CM

## 2019-11-27 NOTE — Telephone Encounter (Signed)
Result of CT scan discussed with patient. Will refer to Oncology.

## 2019-12-01 ENCOUNTER — Telehealth: Payer: Self-pay | Admitting: Hematology
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
## 2019-12-01 NOTE — Telephone Encounter (Signed)
Received a new pt referral from Dr. 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 Pt has been cld and scheduled to see Dr. Irene Limbo on 4/7 at 11am. Pt aware to arrive 15 minutes early.

## 2019-12-02 ENCOUNTER — Inpatient Hospital Stay: Payer: Medicare HMO

## 2019-12-02 ENCOUNTER — Telehealth: Payer: Self-pay | Admitting: Hematology

## 2019-12-02 ENCOUNTER — Inpatient Hospital Stay: Payer: Medicare HMO | Attending: Hematology | Admitting: Hematology

## 2019-12-02 ENCOUNTER — Other Ambulatory Visit: Payer: Self-pay

## 2019-12-02 VITALS — BP 149/78 | HR 73 | Temp 97.8°F | Resp 20 | Ht 64.0 in | Wt 191.3 lb

## 2019-12-02 DIAGNOSIS — R590 Localized enlarged lymph nodes: Secondary | ICD-10-CM | POA: Diagnosis not present

## 2019-12-02 DIAGNOSIS — Z87891 Personal history of nicotine dependence: Secondary | ICD-10-CM | POA: Insufficient documentation

## 2019-12-02 DIAGNOSIS — Z794 Long term (current) use of insulin: Secondary | ICD-10-CM | POA: Diagnosis not present

## 2019-12-02 DIAGNOSIS — Z79899 Other long term (current) drug therapy: Secondary | ICD-10-CM | POA: Insufficient documentation

## 2019-12-02 DIAGNOSIS — R221 Localized swelling, mass and lump, neck: Secondary | ICD-10-CM | POA: Insufficient documentation

## 2019-12-02 DIAGNOSIS — E119 Type 2 diabetes mellitus without complications: Secondary | ICD-10-CM | POA: Insufficient documentation

## 2019-12-02 DIAGNOSIS — I10 Essential (primary) hypertension: Secondary | ICD-10-CM | POA: Insufficient documentation

## 2019-12-02 LAB — CBC WITH DIFFERENTIAL/PLATELET
Abs Immature Granulocytes: 0.01 K/uL (ref 0.00–0.07)
Basophils Absolute: 0 K/uL (ref 0.0–0.1)
Basophils Relative: 1 %
Eosinophils Absolute: 0.2 K/uL (ref 0.0–0.5)
Eosinophils Relative: 3 %
HCT: 43.3 % (ref 39.0–52.0)
Hemoglobin: 14.3 g/dL (ref 13.0–17.0)
Immature Granulocytes: 0 %
Lymphocytes Relative: 39 %
Lymphs Abs: 2.1 K/uL (ref 0.7–4.0)
MCH: 28.8 pg (ref 26.0–34.0)
MCHC: 33 g/dL (ref 30.0–36.0)
MCV: 87.3 fL (ref 80.0–100.0)
Monocytes Absolute: 0.4 K/uL (ref 0.1–1.0)
Monocytes Relative: 8 %
Neutro Abs: 2.7 K/uL (ref 1.7–7.7)
Neutrophils Relative %: 49 %
Platelets: 280 K/uL (ref 150–400)
RBC: 4.96 MIL/uL (ref 4.22–5.81)
RDW: 12.7 % (ref 11.5–15.5)
WBC: 5.4 K/uL (ref 4.0–10.5)
nRBC: 0 % (ref 0.0–0.2)

## 2019-12-02 LAB — HEPATITIS C ANTIBODY: HCV Ab: NONREACTIVE

## 2019-12-02 LAB — CMP (CANCER CENTER ONLY)
ALT: 26 U/L (ref 0–44)
AST: 18 U/L (ref 15–41)
Albumin: 4.3 g/dL (ref 3.5–5.0)
Alkaline Phosphatase: 89 U/L (ref 38–126)
Anion gap: 9 (ref 5–15)
BUN: 16 mg/dL (ref 8–23)
CO2: 31 mmol/L (ref 22–32)
Calcium: 9.6 mg/dL (ref 8.9–10.3)
Chloride: 102 mmol/L (ref 98–111)
Creatinine: 0.78 mg/dL (ref 0.61–1.24)
GFR, Est AFR Am: >60 mL/min (ref >60)
GFR, Estimated: >60 mL/min (ref >60)
Glucose, Bld: 159 mg/dL — ABNORMAL HIGH (ref 70–99)
Potassium: 3.9 mmol/L (ref 3.5–5.1)
Sodium: 142 mmol/L (ref 135–145)
Total Bilirubin: 0.5 mg/dL (ref 0.3–1.2)
Total Protein: 6.8 g/dL (ref 6.5–8.1)

## 2019-12-02 LAB — HEPATITIS B SURFACE ANTIGEN: Hepatitis B Surface Ag: NONREACTIVE

## 2019-12-02 LAB — SEDIMENTATION RATE: Sed Rate: 4 mm/h (ref 0–16)

## 2019-12-02 LAB — LACTATE DEHYDROGENASE: LDH: 171 U/L (ref 98–192)

## 2019-12-02 LAB — HIV ANTIBODY (ROUTINE TESTING W REFLEX): HIV Screen 4th Generation wRfx: NONREACTIVE

## 2019-12-02 LAB — HEPATITIS B CORE ANTIBODY, TOTAL: Hep B Core Total Ab: NONREACTIVE

## 2019-12-02 NOTE — Patient Instructions (Signed)
Thank you for choosing [facility] to provide your oncology and hematology care.   Should you have questions after your visit to the [facility] ([facility]), please contact this office at [phone] between 8:30 AM and 4:30 PM.  Voice mails left after 4:00 PM may not be returned until the following business day.  Calls received after 4:30 PM will be answered by an off-site Nurse Triage Line.    Prescription Refills:  Please have your pharmacy contact us directly for most prescription requests.  Contact the office directly for refills of narcotics (pain medications). Allow 48-72 hours for refills.  Appointments: Please contact the [facility] scheduling department [phone] for questions regarding [facility] appointment scheduling.  Contact the schedulers with any scheduling changes so that your appointment can be rescheduled in a timely manner.   Central Scheduling for [place] [phone] - Call to schedule procedures such as PET scans, CT scans, MRI, Ultrasound, etc.  To afford each patient quality time with our providers, please arrive 30 minutes before your scheduled appointment time.  If you arrive late for your appointment, you may be asked to reschedule.  We strive to give you quality time with our providers, and arriving late affects you and other patients whose appointments are after yours. If you are a no show for multiple scheduled visits, you may be dismissed from the clinic at the providers discretion.     Resources: [facility] Social Workers 336-832-0950 for additional information on assistance programs or assistance connecting with community support programs   [place] DSS  336-641-3447: Information regarding food stamps, Medicaid, and utility assistance [place]AT 336-333-6589   [place] [employer]'s shared-ride transportation service for eligible riders who have a disability that prevents them from riding the fixed route bus.   Medicare Rights Center  800-333-4114 Helps people with Medicare understand their rights and benefits, navigate the Medicare system, and secure the quality healthcare they deserve [employer] 800-227-2345 Assists patients locate various types of support and financial assistance Cancer Care: 1-800-813-HOPE (4673) Provides financial assistance, online support groups, medication/co-pay assistance.   Transportation Assistance for appointments at [facility]: Transportation Coordinator [phone]  Again, thank you for choosing  Cancer Center for your care.       

## 2019-12-02 NOTE — Telephone Encounter (Signed)
Scheduled appts per 4/7 los. Pt declined AVS. Gave pt a print out of appt calendar.

## 2019-12-03 ENCOUNTER — Encounter (HOSPITAL_COMMUNITY): Payer: Self-pay

## 2019-12-03 NOTE — Progress Notes (Unsigned)
Owin Hurney Male, 59 y.o., 1961/07/14 MRN:  [id number] Phone:  [phone] (M) Needs Interpreter: [place] PCP:  Horald Pollen, MD Coverage:  [place] Medicare/[employer] Medicare Hmo/Ppo Next Appt With Radiology ([facility]-US 2) 12/08/2019 at 1:00 PM  RE: Biopsy Received: Yesterday Message Contents  Corrie Mckusick, DO  Brentyn Seehafer, [place] for US guided left neck mass biopsy.   Earleen Newport   Previous Messages  ----- Message -----  From: Lenore Cordia  Sent: 12/02/2019  1:40 PM EDT  To: [facility] Procedure Requests  Subject: Biopsy                      Procedure Requested: [place] core biopsy (lymph nodes)    Reason for Procedure: Left neck mass- large, concerning for lymphoma. Core needle biopies for lymphoma workup    Provider Requesting: Dr Irene Limbo  Provider Telephone:[phone]    Other Info: Rad exam in Epic

## 2019-12-06 ENCOUNTER — Other Ambulatory Visit: Payer: Self-pay | Admitting: Radiology

## 2019-12-08 ENCOUNTER — Ambulatory Visit (HOSPITAL_COMMUNITY): Payer: Medicare HMO

## 2019-12-08 ENCOUNTER — Other Ambulatory Visit: Payer: Self-pay

## 2019-12-08 ENCOUNTER — Ambulatory Visit (HOSPITAL_COMMUNITY)
Admission: RE | Admit: 2019-12-08 | Discharge: 2019-12-08 | Disposition: A | Discharge status: Home / Self Care | Payer: Medicare HMO | Source: Ambulatory Visit | Attending: Hematology | Admitting: Hematology

## 2019-12-08 DIAGNOSIS — R221 Localized swelling, mass and lump, neck: Secondary | ICD-10-CM | POA: Diagnosis not present

## 2019-12-08 DIAGNOSIS — R590 Localized enlarged lymph nodes: Secondary | ICD-10-CM

## 2019-12-08 MED ORDER — LIDOCAINE HCL 1 % IJ SOLN
INTRAMUSCULAR | Status: AC
  Filled 2019-12-08: qty 20

## 2019-12-08 NOTE — Procedures (Signed)
Interventional Radiology Procedure Note  Procedure: US guided bx of left neck mass.  Complications: None Recommendations:  - Ok to shower tomorrow - Do not submerge for 7 days - Routine care   Signed,  Dulcy Fanny. Earleen Newport, DO

## 2019-12-09 ENCOUNTER — Telehealth: Payer: Self-pay | Admitting: Hematology

## 2019-12-09 NOTE — Telephone Encounter (Signed)
Faxed medical records to [facility] at [phone]. Release [id number]

## 2019-12-10 ENCOUNTER — Other Ambulatory Visit: Payer: Self-pay | Admitting: Hematology

## 2019-12-10 ENCOUNTER — Ambulatory Visit (HOSPITAL_COMMUNITY)
Admission: RE | Admit: 2019-12-10 | Discharge: 2019-12-10 | Disposition: A | Discharge status: Home / Self Care | Payer: Medicare HMO | Source: Ambulatory Visit | Attending: Hematology | Admitting: Hematology

## 2019-12-10 ENCOUNTER — Other Ambulatory Visit: Payer: Self-pay

## 2019-12-10 DIAGNOSIS — R590 Localized enlarged lymph nodes: Secondary | ICD-10-CM | POA: Diagnosis not present

## 2019-12-10 DIAGNOSIS — K8020 Calculus of gallbladder without cholecystitis without obstruction: Secondary | ICD-10-CM | POA: Diagnosis not present

## 2019-12-10 DIAGNOSIS — I2510 Atherosclerotic heart disease of native coronary artery without angina pectoris: Secondary | ICD-10-CM | POA: Diagnosis not present

## 2019-12-10 DIAGNOSIS — I700 Atherosclerosis of aorta: Secondary | ICD-10-CM | POA: Diagnosis not present

## 2019-12-10 LAB — GLUCOSE, CAPILLARY: Glucose-Capillary: 123 mg/dL — ABNORMAL HIGH (ref 70–99)

## 2019-12-10 LAB — SURGICAL PATHOLOGY

## 2019-12-10 MED ORDER — FLUDEOXYGLUCOSE F - 18 (FDG) INJECTION
9.8400 | Freq: Once | INTRAVENOUS | Status: AC | PRN
  Administered 2019-12-10: 15:00:00 9.84 via INTRAVENOUS

## 2019-12-11 ENCOUNTER — Telehealth: Payer: Self-pay | Admitting: Hematology

## 2019-12-11 NOTE — Telephone Encounter (Signed)
Rescheduled 04/22 appointment to 04/30 due to provider being on pal, patient has been called and notified.

## 2019-12-14 ENCOUNTER — Other Ambulatory Visit: Payer: Self-pay

## 2019-12-14 ENCOUNTER — Encounter: Payer: Self-pay | Admitting: Emergency Medicine

## 2019-12-14 ENCOUNTER — Ambulatory Visit (INDEPENDENT_AMBULATORY_CARE_PROVIDER_SITE_OTHER): Payer: Medicare HMO | Admitting: Emergency Medicine

## 2019-12-14 VITALS — BP 105/61 | HR 76 | Temp 97.6°F | Resp 16 | Ht 64.0 in | Wt 190.0 lb

## 2019-12-14 DIAGNOSIS — C8591 Non-Hodgkin lymphoma, unspecified, lymph nodes of head, face, and neck: Secondary | ICD-10-CM | POA: Diagnosis not present

## 2019-12-14 DIAGNOSIS — E1159 Type 2 diabetes mellitus with other circulatory complications: Secondary | ICD-10-CM | POA: Diagnosis not present

## 2019-12-14 DIAGNOSIS — E1165 Type 2 diabetes mellitus with hyperglycemia: Secondary | ICD-10-CM

## 2019-12-14 DIAGNOSIS — I10 Essential (primary) hypertension: Secondary | ICD-10-CM

## 2019-12-14 LAB — CBC WITH DIFFERENTIAL/PLATELET
Basophils Absolute: 0 10*3/uL (ref 0.0–0.2)
Basos: 1 %
EOS (ABSOLUTE): 0.1 10*3/uL (ref 0.0–0.4)
Eos: 2 %
Hematocrit: 46.2 % (ref 37.5–51.0)
Hemoglobin: 15 g/dL (ref 13.0–17.7)
Immature Grans (Abs): 0 10*3/uL (ref 0.0–0.1)
Immature Granulocytes: 0 %
Lymphocytes Absolute: 1.9 10*3/uL (ref 0.7–3.1)
Lymphs: 33 %
MCH: 28.8 pg (ref 26.6–33.0)
MCHC: 32.5 g/dL (ref 31.5–35.7)
MCV: 89 fL (ref 79–97)
Monocytes Absolute: 0.4 10*3/uL (ref 0.1–0.9)
Monocytes: 8 %
Neutrophils Absolute: 3.2 10*3/uL (ref 1.4–7.0)
Neutrophils: 56 %
Platelets: 265 10*3/uL (ref 150–450)
RBC: 5.2 x10E6/uL (ref 4.14–5.80)
RDW: 13.2 % (ref 11.6–15.4)
WBC: 5.6 10*3/uL (ref 3.4–10.8)

## 2019-12-14 LAB — POCT GLYCOSYLATED HEMOGLOBIN (HGB A1C): Hemoglobin A1C: 9.2 % — AB (ref 4.0–5.6)

## 2019-12-14 LAB — COMPREHENSIVE METABOLIC PANEL
ALT: 26 IU/L (ref 0–44)
AST: 22 IU/L (ref 0–40)
Albumin/Globulin Ratio: 2.9 — ABNORMAL HIGH (ref 1.2–2.2)
Albumin: 4.7 g/dL (ref 3.8–4.8)
Alkaline Phosphatase: 82 IU/L (ref 39–117)
BUN/Creatinine Ratio: 17 (ref 10–24)
BUN: 12 mg/dL (ref 8–27)
Bilirubin Total: 0.5 mg/dL (ref 0.0–1.2)
CO2: 22 mmol/L (ref 20–29)
Calcium: 10 mg/dL (ref 8.6–10.2)
Chloride: 99 mmol/L (ref 96–106)
Creatinine, Ser: 0.71 mg/dL — ABNORMAL LOW (ref 0.76–1.27)
GFR calc Af Amer: 117 mL/min/{1.73_m2} (ref >59)
GFR calc non Af Amer: 101 mL/min/{1.73_m2} (ref >59)
Globulin, Total: 1.6 g/dL (ref 1.5–4.5)
Glucose: 112 mg/dL — ABNORMAL HIGH (ref 65–99)
Potassium: 3.8 mmol/L (ref 3.5–5.2)
Sodium: 139 mmol/L (ref 134–144)
Total Protein: 6.3 g/dL (ref 6.0–8.5)

## 2019-12-14 LAB — LIPID PANEL
Chol/HDL Ratio: 2.6 ratio (ref 0.0–5.0)
Cholesterol, Total: 134 mg/dL (ref 100–199)
HDL: 52 mg/dL (ref >39)
LDL Chol Calc (NIH): 67 mg/dL (ref 0–99)
Triglycerides: 75 mg/dL (ref 0–149)
VLDL Cholesterol Cal: 15 mg/dL (ref 5–40)

## 2019-12-14 LAB — GLUCOSE, POCT (MANUAL RESULT ENTRY): POC Glucose: 114 mg/dl — AB (ref 70–99)

## 2019-12-14 MED ORDER — GLIPIZIDE 10 MG PO TABS: 10.0000 mg | ORAL_TABLET | Freq: Every day | ORAL | 3 refills | Status: DC

## 2019-12-14 MED ORDER — TRULICITY 0.75 MG/0.5ML ~~LOC~~ SOAJ: 0.7500 mg | SUBCUTANEOUS | 5 refills | Status: DC

## 2019-12-14 NOTE — Assessment & Plan Note (Signed)
Well-controlled hypertension. Continue present medications.  No changes. 

## 2019-12-14 NOTE — Progress Notes (Signed)
Damon Dudley 59 y.o.   Chief Complaint  Patient presents with  . Diabetes    and HTN follow up 3 month    HISTORY OF PRESENT ILLNESS: This is a 59 y.o. male 62-month follow-up of diabetes and hypertension. 1.  Diabetes: On Metformin, glipizide, and Tresiba 25 units in the evening.  No available home sugar readings. Lab Results  Component Value Date   HGBA1C 8.7 (A) 09/01/2019   2.  Hypertension: On Vaseretic.  Doing well. BP Readings from Last 3 Encounters:  12/14/19 105/61  12/02/19 (!) 149/78  11/17/19 112/75   3.  Status post biopsy of left neck lymph node.  Results still pending.  Has appointment with oncologist Dr.Kale next week for follow-up. DG Chest 2 View  Result Date: 11/17/2019 CLINICAL DATA:  Left neck mass. EXAM: CHEST - 2 VIEW COMPARISON:  None. FINDINGS: The heart size and mediastinal contours are within normal limits. Both lungs are clear. The visualized skeletal structures are unremarkable. IMPRESSION: No active cardiopulmonary disease. Electronically Signed   By: Marijo Conception M.D.   On: 11/17/2019 16:25   CT Soft Tissue Neck W Contrast  Result Date: 11/26/2019 CLINICAL DATA:  Left-sided neck mass noted for the last 6 months. Painful, particularly with talking. EXAM: CT NECK WITH CONTRAST TECHNIQUE: Multidetector CT imaging of the neck was performed using the standard protocol following the bolus administration of intravenous contrast. CONTRAST:  65mL OMNIPAQUE IOHEXOL 300 MG/ML  SOLN COMPARISON:  None. FINDINGS: Pharynx and larynx: No mucosal or submucosal lesion is seen. Salivary glands: Parotid and submandibular glands are normal. Thyroid: Normal Lymph nodes: No lymphadenopathy on the right. Lobular mass in the supraclavicular region on the left measuring 5 cm right to left, 3 cm front to back and 3.5 cm cephalo caudal. This is presumed to represent a lymph node mass. There is also some abnormal tissue that probably represents abnormal lymph nodes lower at the  thoracic inlet on the left just lateral to the left lobe of the thyroid. This tissue is indistinct but measures in total about 2 cm in size and is presumed represent a manifestation of the same process. No other enlarged nodes by size criteria on the left. Vascular: Ordinary atherosclerotic change at the carotid bifurcations. Limited intracranial: Normal Visualized orbits: Normal Mastoids and visualized paranasal sinuses: Mucosal inflammatory changes of the left maxillary sinus. Skeleton: Mild cervical spondylosis. Upper chest: Negative. No superior mediastinal lymphadenopathy. Lung apices are clear. Other: None IMPRESSION: 5 x 3 x 3.5 cm lobular mass in the left supraclavicular region presumed to represent a conglomeration of pathologic lymph nodes. Second area of more indistinct but abnormal tissue lower in the supraclavicular region, at the thoracic inlet level, just lateral to the left lobe of the thyroid, measuring about 2 cm in size in total. Differential diagnosis is metastatic disease versus lymphoma. No evidence of superior mediastinal lymphadenopathy. No evidence of mucosal primary tumor. Electronically Signed   By: Nelson Chimes M.D.   On: 11/26/2019 20:01   NM PET Image Initial (PI) Skull Base To Thigh  Result Date: 12/11/2019 CLINICAL DATA:  Initial treatment strategy for cervical lymphadenopathy. EXAM: NUCLEAR MEDICINE PET SKULL BASE TO THIGH TECHNIQUE: 9.8 mCi F-18 FDG was injected intravenously. Full-ring PET imaging was performed from the skull base to thigh after the radiotracer. CT data was obtained and used for attenuation correction and anatomic localization. Fasting blood glucose: 123 mg/dl COMPARISON:  CT neck 11/26/2019. FINDINGS: Mediastinal blood pool activity: SUV max 2.4 Liver activity:  SUV max 4.3 NECK: Conglomerate left supraclavicular nodal mass measures 3.5 x 3.5 cm (4/42) an SUV max of 13.7. No additional hypermetabolic lymph nodes. Incidental CT findings: None. CHEST: Low left  internal jugular lymph nodes are subcentimeter in short axis size and do not show hypermetabolism above blood pool. No hypermetabolic mediastinal, hilar or axillary lymph nodes. No hypermetabolic pulmonary nodules. Incidental CT findings: Atherosclerotic calcification of the aorta and coronary arteries. Heart is mildly enlarged. No pericardial or pleural effusion. 3 mm nodule adjacent to the right major fissure (8/37), too small for PET resolution. ABDOMEN/PELVIS: No abnormal hypermetabolism in the liver, adrenal glands, spleen or pancreas. No hypermetabolic lymph nodes. Nodular areas of hypermetabolism in the prostate. Focal hypermetabolism in the subcutaneous fat overlying the right iliac wing may be iatrogenic in etiology. Incidental CT findings: Liver is unremarkable. A stone is seen in the gallbladder. Adrenal glands are unremarkable. Renal vascular calcifications bilaterally. Spleen is normal in size. Pancreas, stomach and bowel are grossly unremarkable. Atherosclerotic calcification of the aorta without abdominal aortic aneurysm. Common iliac arteries are ectatic bilaterally, measuring 1.7 cm on the right and 1.9 cm on left. Prostate is mildly enlarged. SKELETON: No abnormal osseous hypermetabolism. Incidental CT findings: Degenerative changes in the spine. IMPRESSION: 1. Hypermetabolic left supraclavicular nodal mass,, as on 11/26/2019 and as biopsied on 12/08/2019. No evidence of additional hypermetabolic adenopathy in the neck, chest, abdomen or pelvis. 2. Nodular areas of hypermetabolism in the prostate. Malignancy cannot be excluded. 3. Cholelithiasis. 4. Aortic atherosclerosis (ICD10-I70.0). Coronary artery calcification. Electronically Signed   By: Lorin Picket M.D.   On: 12/11/2019 09:45   [place] CORE BIOPSY (LYMPH NODES)  Result Date: 12/08/2019 INDICATION: 59 year old male with a history of left neck mass concerning for lymphoma EXAM: IMAGE GUIDED BIOPSY OF LEFT NECK MASS MEDICATIONS: None.  ANESTHESIA/SEDATION: None FLUOROSCOPY TIME:  None COMPLICATIONS: None PROCEDURE: Informed written consent was obtained from the patient after a thorough discussion of the procedural risks, benefits and alternatives. All questions were addressed. Maximal Sterile Barrier Technique was utilized including caps, mask, sterile gowns, sterile gloves, sterile drape, hand hygiene and skin antiseptic. A timeout was performed prior to the initiation of the procedure. Ultrasound survey was performed with images stored and sent to PACs. The left neck was prepped with chlorhexidine in a sterile fashion, and a sterile drape was applied covering the operative field. A sterile gown and sterile gloves were used for the procedure. Local anesthesia was provided with 1% Lidocaine. Ultrasound guidance was used to infiltrate the region with 1% lidocaine for local anesthesia. Multiple separate 18 gauge core biopsy were then acquired of the left neck mass using ultrasound guidance. Images were stored. Final image was stored after biopsy. Patient tolerated the procedure well and remained hemodynamically stable throughout. No complications were encountered and no significant blood loss was encounter IMPRESSION: Status post ultrasound-guided biopsy of left neck mass. Signed, Dulcy Fanny. Dellia Nims, RPVI [facility] Electronically Signed   By: Corrie Mckusick D.O.   On: 12/08/2019 14:06     HPI   Prior to Admission medications   Medication Sig Start Date End Date Taking? Authorizing Provider  enalapril-hydrochlorothiazide (VASERETIC) 10-25 MG tablet Take 1 tablet by mouth daily. 06/01/19  Yes Quincy Prisco, Ines Bloomer, MD  glipiZIDE (GLUCOTROL) 10 MG tablet Take 1 tablet (10 mg total) by mouth daily before breakfast. 09/01/19  Yes [place], Ines Bloomer, MD  insulin degludec (TRESIBA FLEXTOUCH) 100 UNIT/ML SOPN FlexTouch Pen Inject 0.25 mLs (25  Units total) into the skin at bedtime.  10/19/19  Yes Zarius Furr, Ines Bloomer, MD  atorvastatin (LIPITOR) 40 MG tablet Take 1 tablet (40 mg total) by mouth daily. 06/01/19 08/30/19  Horald Pollen, MD  metFORMIN (GLUCOPHAGE) 1000 MG tablet Take 1 tablet (1,000 mg total) by mouth 2 (two) times daily with a meal. 06/01/19 08/30/19  Ardean Simonich, Ines Bloomer, MD  neomycin-polymyxin-hydrocortisone (CORTISPORIN) OTIC solution Apply 1-2 drops to toe after soaking twice a day Patient not taking: Reported on 12/14/2019 06/03/19   Wallene Huh, DPM    No Known Allergies  Patient Active Problem List   Diagnosis Date Noted  . Mass of left side of neck 11/17/2019  . Hypertension associated with diabetes ([place]) 12/18/2018  . Type 2 diabetes mellitus with hyperglycemia, without long-term current use of insulin ([place]) 12/18/2018    Past Medical History:  Diagnosis Date  . Diabetes mellitus without complication ([place])   . Hypertension     History reviewed. No pertinent surgical history.  Social History   Socioeconomic History  . Marital status: Married    Spouse name: Not on file  . Number of children: Not on file  . Years of education: Not on file  . Highest education level: Not on file  Occupational History  . Not on file  Tobacco Use  . Smoking status: Former Smoker    Quit date: 08/27/2010    Years since quitting: 9.3  . Smokeless tobacco: Never Used  Substance and Sexual Activity  . Alcohol use: Not Currently  . Drug use: Never  . Sexual activity: Not on file  Other Topics Concern  . Not on file  Social History Narrative  . Not on file   Social Determinants of Health   Financial Resource Strain:   . Difficulty of Paying Living Expenses:   Food Insecurity:   . Worried About Charity fundraiser in the Last Year:   . Arboriculturist in the Last Year:   Transportation Needs:   . Film/video editor (Medical):   Marland Kitchen Lack of Transportation (Non-Medical):   Physical Activity:   . Days of Exercise per Week:   . Minutes of  Exercise per Session:   Stress:   . Feeling of Stress :   Social Connections:   . Frequency of Communication with Friends and Family:   . Frequency of Social Gatherings with Friends and Family:   . Attends Religious Services:   . Active Member of Clubs or Organizations:   . Attends Archivist Meetings:   Marland Kitchen Marital Status:   Intimate Partner Violence:   . Fear of Current or Ex-Partner:   . Emotionally Abused:   Marland Kitchen Physically Abused:   . Sexually Abused:     History reviewed. No pertinent family history.   Review of Systems  Constitutional: Negative.  Negative for chills and fever.  HENT: Negative.  Negative for congestion and sore throat.   Eyes: Negative.   Respiratory: Negative.  Negative for cough and shortness of breath.   Cardiovascular: Negative.  Negative for chest pain and palpitations.  Gastrointestinal: Negative.  Negative for abdominal pain, blood in stool, diarrhea, nausea and vomiting.  Genitourinary: Negative.  Negative for dysuria and hematuria.  Musculoskeletal: Negative.   Skin: Negative.  Negative for rash.  Neurological: Negative.  Negative for dizziness and headaches.  Endo/Heme/Allergies: Negative.   All other systems reviewed and are negative.  Today's Vitals   12/14/19 0846  BP: 105/61  Pulse: 76  Resp: 16  Temp: 97.6 F (36.4 C)  TempSrc: Temporal  SpO2: 96%  Weight: 190 lb (86.2 kg)  Height: 5\' 4"  (1.626 m)   Body mass index is 32.61 kg/m.   Physical Exam Vitals reviewed.  Constitutional:      Appearance: Normal appearance.  HENT:     Head: Normocephalic.  Eyes:     Extraocular Movements: Extraocular movements intact.     Pupils: Pupils are equal, round, and reactive to light.  Cardiovascular:     Rate and Rhythm: Normal rate and regular rhythm.     Pulses: Normal pulses.     Heart sounds: Normal heart sounds.  Pulmonary:     Effort: Pulmonary effort is normal.     Breath sounds: Normal breath sounds.  Abdominal:      Tenderness: There is no abdominal tenderness.  Musculoskeletal:        General: Normal range of motion.     Cervical back: Normal range of motion.  Skin:    General: Skin is warm and dry.     Capillary Refill: Capillary refill takes less than 2 seconds.  Neurological:     General: No focal deficit present.     Mental Status: He is alert and oriented to person, place, and time.  Psychiatric:        Mood and Affect: Mood normal.        Behavior: Behavior normal.    Results for orders placed or performed in visit on 12/14/19 (from the past 24 hour(s))  POCT glucose (manual entry)     Status: Abnormal   Collection Time: 12/14/19  9:19 AM  Result Value Ref Range   POC Glucose 114 (A) 70 - 99 mg/dl  POCT glycosylated hemoglobin (Hb A1C)     Status: Abnormal   Collection Time: 12/14/19  9:23 AM  Result Value Ref Range   Hemoglobin A1C 9.2 (A) 4.0 - 5.6 %   HbA1c POC (<> result, manual entry)     HbA1c, POC (prediabetic range)     HbA1c, POC (controlled diabetic range)     A total of 45 minutes was spent with the patient, greater than 50% of which was in counseling/coordination of care regarding uncontrolled diabetes and cardiovascular risks associated with this, review of all medications including new medication Trulicity, review of most recent blood work results including today's hemoglobin A1c, review of most recent office visit notes, review of oncologist's notes and recent diagnostic reports, diet and nutrition, prognosis and need for follow-up.   ASSESSMENT & PLAN:  Type 2 diabetes mellitus with hyperglycemia, without long-term current use of insulin (HCC) Uncontrolled diabetes with hemoglobin A1c higher than before at 9.2.  Compliant with medications.  Continue glipizide 10 mg with food in the morning, Tresiba 25 units in the evening, and Metformin 1000 mg twice a day.  Will add Trulicity A999333 mg weekly.  Hypoglycemia precautions given.  Diet and nutrition discussed with  patient. Follow-up in 3 months.  Hypertension associated with diabetes ([place]) Well-controlled hypertension.  Continue present medications.  No changes.  Rogerick was seen today for diabetes.  Diagnoses and all orders for this visit:  Hypertension associated with diabetes ([place]) -     CBC with Differential/Platelet -     Comprehensive metabolic panel -     Lipid panel  Type 2 diabetes mellitus with hyperglycemia, without long-term current use of insulin (HCC) -     POCT glucose (manual entry) -     POCT glycosylated hemoglobin (  Hb A1C) -     glipiZIDE (GLUCOTROL) 10 MG tablet; Take 1 tablet (10 mg total) by mouth daily before breakfast. -     Discontinue: Dulaglutide (TRULICITY) A999333 0000000 SOPN; Inject 0.75 mg into the skin once a week. -     Dulaglutide (TRULICITY) A999333 0000000 SOPN; Inject 0.75 mg into the skin once a week.  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  If you have not heard from [place] then please contact us. The fastest way to get your results is to register for My Chart.   IF you received an x-ray today, you will receive an invoice from [facility] Radiology. Please contact [facility] Radiology at [phone] with questions or concerns regarding your invoice.   IF you received labwork today, you will receive an invoice from [place]. Please contact [facility] at [phone] with questions or concerns regarding your invoice.   Our billing staff will not be able to assist you with questions regarding bills from these companies.  You will be contacted with the lab results as soon as they are available. The fastest way to get your results is to activate your My Chart account. Instructions are located on the last page of this paperwork. If you have not heard from [place] regarding the results in 2 weeks, please contact this  office.     Diabetes mellitus y nutricin, en adultos Diabetes Mellitus and Nutrition, Adult Si sufre de diabetes (diabetes mellitus), es muy importante tener hbitos alimenticios saludables debido a que sus niveles de Designer, television/film set sangre (glucosa) se ven afectados en gran medida por lo que come y bebe. [place] alimentos saludables en las cantidades [place], aproximadamente a la [employer], [place] ayudar a:  Aeronautical engineer glucemia.  Disminuir el riesgo de sufrir una enfermedad cardaca.  Mejorar la presin arterial.  Science writer o mantener un peso saludable. Todas las personas que sufren de diabetes son diferentes y cada una tiene necesidades diferentes en cuanto a un plan de alimentacin. El mdico puede recomendarle que trabaje con un especialista en dietas y nutricin (nutricionista) para Financial trader plan para usted. Su plan de alimentacin puede variar segn factores como:  Las caloras que necesita.  Los medicamentos que toma.  Su peso.  Sus niveles de glucemia, presin arterial y colesterol.  Su nivel de [place].  Otras afecciones que tenga, como enfermedades cardacas o renales. Cmo me afectan los carbohidratos? Los carbohidratos, o hidratos de carbono, afectan su nivel de glucemia ms que cualquier otro tipo de alimento. La ingesta de carbohidratos naturalmente aumenta la cantidad de [employer]. El recuento de carbohidratos es un mtodo destinado a Catering manager un registro de la cantidad de carbohidratos que se consumen. El recuento de carbohidratos es importante para Theatre manager la glucemia a un nivel saludable, especialmente si utiliza insulina o toma determinados medicamentos por va oral para la diabetes. Es importante conocer la cantidad de carbohidratos que se pueden ingerir en cada comida sin correr Engineer, manufacturing. Esto es Psychologist, forensic. Su nutricionista puede ayudarlo a calcular la cantidad de carbohidratos que debe ingerir en cada comida y  en cada refrigerio. Entre los alimentos que contienen carbohidratos, se incluyen:  Pan, cereal, arroz, pastas y galletas.  Papas y maz.  Guisantes, frijoles y lentejas.  Leche y Estate agent.  Lambert Mody y [place].  Postres, como pasteles, galletas, helado y caramelos. Cmo me afecta el alcohol? El alcohol puede provocar disminuciones sbitas de la glucemia (hipoglucemia), especialmente si utiliza insulina o toma determinados medicamentos por va oral para la diabetes. La hipoglucemia es una afeccin potencialmente mortal. Los sntomas de la hipoglucemia (somnolencia, mareos y confusin) son similares a los sntomas de haber consumido demasiado alcohol. Si el mdico afirma que el alcohol es seguro para usted, [place] estas pautas:  Limite el consumo de alcohol a no ms de 44medida por da si es mujer y no est [place], y a 63medidas si es hombre. Una medida equivale a 12oz (351ml) de cerveza, 5oz (14ml) de vino o 1oz (20ml) de bebidas alcohlicas de alta graduacin.  No beba con el estmago vaco.  Mantngase hidratado bebiendo agua, refrescos dietticos o t helado sin azcar.  Tenga en cuenta que los refrescos comunes, los jugos y otras bebida para Optician, dispensing pueden contener mucha azcar y se deben contar como carbohidratos. Cules son algunos consejos para seguir este plan?  Leer las etiquetas de los alimentos  Comience por leer el tamao de la porcin en la "Informacin nutricional" en las etiquetas de los alimentos envasados y las bebidas. La cantidad de caloras, carbohidratos, grasas y otros nutrientes mencionados en la etiqueta se basan en una porcin del alimento. Muchos alimentos contienen ms de una porcin por envase.  Verifique la cantidad total de gramos (g) de carbohidratos totales en una porcin. Puede calcular la cantidad de porciones de carbohidratos al dividir el total de carbohidratos por 15. Por ejemplo, si un alimento tiene un total de 30g de carbohidratos, equivale a  2 porciones de carbohidratos.  Verifique la cantidad de gramos (g) de grasas saturadas y grasas trans en una porcin. Escoja alimentos que no contengan grasa o que tengan un bajo contenido.  Verifique la cantidad de miligramos (mg) de sal (sodio) en una porcin. La [employer] de las personas deben limitar la ingesta de sodio total a menos de 2300mg  por Training and development officer.  Siempre consulte la informacin nutricional de los alimentos etiquetados como "con bajo contenido de grasa" o "sin grasa". Estos alimentos pueden tener un mayor contenido de Location manager agregada o carbohidratos refinados, y deben evitarse.  Hable con su nutricionista para identificar sus objetivos diarios en cuanto a los nutrientes mencionados en la etiqueta. Al ir de compras  Evite comprar alimentos procesados, enlatados o precocinados. Estos alimentos tienden a Special educational needs teacher mayor cantidad de [place], sodio y azcar agregada.  Compre en la zona exterior de la tienda de comestibles. Esta zona incluye frutas y verduras frescas, granos a granel, carnes frescas y productos lcteos frescos. Al cocinar  Utilice mtodos de coccin a baja temperatura, como hornear, en lugar de mtodos de coccin a alta temperatura, como frer en abundante aceite.  Cocine con aceites saludables, como el aceite de [place], canola o [place].  Evite cocinar con manteca, crema o carnes con alto contenido de grasa. Planificacin de las comidas  Coma las comidas y los refrigerios regularmente, preferentemente a la misma hora todos [place]. Evite pasar largos perodos de tiempo sin comer.  Consuma alimentos ricos en fibra, como frutas frescas, verduras, frijoles y cereales integrales. Consulte a su nutricionista sobre cuntas porciones de carbohidratos puede consumir en cada comida.  Consuma entre 4 y 6 onzas (oz) de protenas magras por da, como carnes [place], pollo, pescado, huevos o tofu. Una onza de protena magra equivale a: ? 1 onza de carne, pollo o  pescado. ? 1huevo. ?  taza de tofu.  Coma algunos alimentos por da que contengan grasas saludables, como aguacates, frutos secos, semillas y pescado. Estilo de vida  Controle su nivel de glucemia con regularidad.  Haga actividad fsica habitualmente como se lo haya indicado el mdico. Esto puede incluir lo siguiente: ? 112minutos semanales de ejercicio de intensidad moderada o alta. Esto podra incluir caminatas dinmicas, ciclismo o gimnasia acutica. ? Realizar ejercicios de elongacin y de fortalecimiento, como yoga o levantamiento de pesas, por lo menos 2veces por semana.  Tome los [employer] se lo haya indicado el mdico.  No consuma ningn producto que contenga nicotina o tabaco, como cigarrillos y Psychologist, sport and exercise. Si necesita ayuda para dejar de fumar, consulte al [employer] con un asesor o instructor en diabetes para identificar estrategias para controlar el estrs y cualquier desafo emocional y social. Preguntas para hacerle al mdico  Es necesario que consulte a Radio broadcast assistant en el cuidado de la diabetes?  Es necesario que me rena con un nutricionista?  A qu nmero puedo llamar si tengo preguntas?  Cules son los mejores momentos para controlar la glucemia? Dnde encontrar ms informacin:  Asociacin Estadounidense de la Diabetes (American Diabetes Association): diabetes.org  Academia de Nutricin y Information systems manager (Academy of Nutrition and Dietetics): www.eatright.[place] Diabetes y las Enfermedades Digestivas y [facility] of Diabetes and Digestive and Kidney Diseases, NIH): [employer]Funeral.dk Resumen  Un plan de alimentacin saludable lo ayudar a Aeronautical engineer glucemia y Theatre manager un estilo de vida saludable.  Trabajar con un especialista en dietas y nutricin (nutricionista) puede ayudarlo a Insurance claims handler de alimentacin para usted.  Tenga en cuenta que los carbohidratos (hidratos de  carbono) y el alcohol tienen efectos inmediatos en sus niveles de glucemia. Es importante contar los carbohidratos que ingiere y consumir alcohol con prudencia. Esta informacin no tiene Marine scientist el consejo del mdico. Asegrese de hacerle al mdico cualquier pregunta que tenga. Document Revised: 04/23/2017 Document Reviewed: 12/03/2016 Elsevier Patient Education  2020 Elsevier Inc.       Agustina Caroli, MD [facility]

## 2019-12-14 NOTE — Assessment & Plan Note (Signed)
Uncontrolled diabetes with hemoglobin A1c higher than before at 9.2.  Compliant with medications.  Continue glipizide 10 mg with food in the morning, Tresiba 25 units in the evening, and Metformin 1000 mg twice a day.  Will add Trulicity [id number] mg weekly.  Hypoglycemia precautions given.  Diet and nutrition discussed with patient. Follow-up in 3 months.

## 2019-12-14 NOTE — Patient Instructions (Addendum)
   If you have lab work done today you will be contacted with your lab results within the next 2 weeks.  If you have not heard from us then please contact us. The fastest way to get your results is to register for My Chart.   IF you received an x-ray today, you will receive an invoice from [facility]. Please contact [facility] at [phone] with questions or concerns regarding your invoice.   IF you received labwork today, you will receive an invoice from [facility]. Please contact [facility] at [phone] with questions or concerns regarding your invoice.   Our billing staff will not be able to assist you with questions regarding bills from these companies.  You will be contacted with the lab results as soon as they are available. The fastest way to get your results is to activate your My Chart account. Instructions are located on the last page of this paperwork. If you have not heard from us regarding the results in 2 weeks, please contact this office.     Diabetes mellitus y nutricin, en adultos Diabetes Mellitus and Nutrition, Adult Si sufre de diabetes (diabetes mellitus), es muy importante tener hbitos alimenticios saludables debido a que sus niveles de azcar en la sangre (glucosa) se ven afectados en gran medida por lo que come y bebe. [place]er alimentos saludables en las cantidades adecuadas, aproximadamente a la misma hora todos los das, lo ayudar a:  Controlar la glucemia.  Disminuir el riesgo de sufrir una enfermedad cardaca.  Mejorar la presin arterial.  Alcanzar o mantener un peso saludable. Todas las personas que sufren de diabetes son diferentes y cada una tiene necesidades diferentes en cuanto a un plan de alimentacin. El mdico puede recomendarle que trabaje con un especialista en dietas y nutricin (nutricionista) para elaborar el mejor plan para usted. Su plan de alimentacin puede variar segn factores como:  Las caloras que  necesita.  Los medicamentos que toma.  Su peso.  Sus niveles de glucemia, presin arterial y colesterol.  Su nivel de actividad.  Otras afecciones que tenga, como enfermedades cardacas o renales. Cmo me afectan los carbohidratos? Los carbohidratos, o hidratos de carbono, afectan su nivel de glucemia ms que cualquier otro tipo de alimento. La ingesta de carbohidratos naturalmente aumenta la cantidad de glucosa en la sangre. El recuento de carbohidratos es un mtodo destinado a llevar un registro de la cantidad de carbohidratos que se consumen. El recuento de carbohidratos es importante para mantener la glucemia a un nivel saludable, especialmente si utiliza insulina o toma determinados medicamentos por va oral para la diabetes. Es importante conocer la cantidad de carbohidratos que se pueden ingerir en cada comida sin correr ningn riesgo. Esto es diferente en cada persona. Su nutricionista puede ayudarlo a calcular la cantidad de carbohidratos que debe ingerir en cada comida y en cada refrigerio. Entre los alimentos que contienen carbohidratos, se incluyen:  Pan, cereal, arroz, pastas y galletas.  Papas y maz.  Guisantes, frijoles y lentejas.  Leche y yogur.  Frutas y jugo.  Postres, como pasteles, galletas, helado y caramelos. Cmo me afecta el alcohol? El alcohol puede provocar disminuciones sbitas de la glucemia (hipoglucemia), especialmente si utiliza insulina o toma determinados medicamentos por va oral para la diabetes. La hipoglucemia es una afeccin potencialmente mortal. Los sntomas de la hipoglucemia (somnolencia, mareos y confusin) son similares a los sntomas de haber consumido demasiado alcohol. Si el mdico afirma que el alcohol es seguro para usted, siga estas pautas:    Limite el consumo de alcohol a no ms de 1medida por da si es mujer y no est embarazada, y a 2medidas si es hombre. Una medida equivale a 12oz (355ml) de cerveza, 5oz (148ml) de vino o  1oz (44ml) de bebidas alcohlicas de alta graduacin.  No beba con el estmago vaco.  Mantngase hidratado bebiendo agua, refrescos dietticos o t helado sin azcar.  Tenga en cuenta que los refrescos comunes, los jugos y otras bebida para mezclar pueden contener mucha azcar y se deben contar como carbohidratos. Cules son algunos consejos para seguir este plan?  Leer las etiquetas de los alimentos  Comience por leer el tamao de la porcin en la "Informacin nutricional" en las etiquetas de los alimentos envasados y las bebidas. La cantidad de caloras, carbohidratos, grasas y otros nutrientes mencionados en la etiqueta se basan en una porcin del alimento. Muchos alimentos contienen ms de una porcin por envase.  Verifique la cantidad total de gramos (g) de carbohidratos totales en una porcin. Puede calcular la cantidad de porciones de carbohidratos al dividir el total de carbohidratos por 15. Por ejemplo, si un alimento tiene un total de 30g de carbohidratos, equivale a 2 porciones de carbohidratos.  Verifique la cantidad de gramos (g) de grasas saturadas y grasas trans en una porcin. Escoja alimentos que no contengan grasa o que tengan un bajo contenido.  Verifique la cantidad de miligramos (mg) de sal (sodio) en una porcin. La mayora de las personas deben limitar la ingesta de sodio total a menos de 2300mg por da.  Siempre consulte la informacin nutricional de los alimentos etiquetados como "con bajo contenido de grasa" o "sin grasa". Estos alimentos pueden tener un mayor contenido de azcar agregada o carbohidratos refinados, y deben evitarse.  Hable con su nutricionista para identificar sus objetivos diarios en cuanto a los nutrientes mencionados en la etiqueta. Al ir de compras  Evite comprar alimentos procesados, enlatados o precocinados. Estos alimentos tienden a tener una mayor cantidad de grasa, sodio y azcar agregada.  Compre en la zona exterior de la tienda de  comestibles. Esta zona incluye frutas y verduras frescas, granos a granel, carnes frescas y productos lcteos frescos. Al cocinar  Utilice mtodos de coccin a baja temperatura, como hornear, en lugar de mtodos de coccin a alta temperatura, como frer en abundante aceite.  Cocine con aceites saludables, como el aceite de oliva, canola o girasol.  Evite cocinar con manteca, crema o carnes con alto contenido de grasa. Planificacin de las comidas  Coma las comidas y los refrigerios regularmente, preferentemente a la misma hora todos los das. Evite pasar largos perodos de tiempo sin comer.  Consuma alimentos ricos en fibra, como frutas frescas, verduras, frijoles y cereales integrales. Consulte a su nutricionista sobre cuntas porciones de carbohidratos puede consumir en cada comida.  Consuma entre 4 y 6 onzas (oz) de protenas magras por da, como carnes magras, pollo, pescado, huevos o tofu. Una onza de protena magra equivale a: ? 1 onza de carne, pollo o pescado. ? 1huevo. ?  taza de tofu.  Coma algunos alimentos por da que contengan grasas saludables, como aguacates, frutos secos, semillas y pescado. Estilo de vida  Controle su nivel de glucemia con regularidad.  Haga actividad fsica habitualmente como se lo haya indicado el mdico. Esto puede incluir lo siguiente: ? 150minutos semanales de ejercicio de intensidad moderada o alta. Esto podra incluir caminatas dinmicas, ciclismo o gimnasia acutica. ? Realizar ejercicios de elongacin y de fortalecimiento, como yoga o levantamiento   de pesas, por lo menos 2veces por semana.  Tome los medicamentos como se lo haya indicado el mdico.  No consuma ningn producto que contenga nicotina o tabaco, como cigarrillos y cigarrillos electrnicos. Si necesita ayuda para dejar de fumar, consulte al mdico.  Trabaje con un asesor o instructor en diabetes para identificar estrategias para controlar el estrs y cualquier desafo emocional  y social. Preguntas para hacerle al mdico  Es necesario que consulte a un instructor en el cuidado de la diabetes?  Es necesario que me rena con un nutricionista?  A qu nmero puedo llamar si tengo preguntas?  Cules son los mejores momentos para controlar la glucemia? Dnde encontrar ms informacin:  Asociacin Estadounidense de la Diabetes (American Diabetes Association): diabetes.org  Academia de Nutricin y Diettica (Academy of Nutrition and Dietetics): www.eatright.org  Instituto Nacional de la Diabetes y las Enfermedades Digestivas y Renales (National Institute of Diabetes and Digestive and Kidney Diseases, NIH): www.niddk.nih.gov Resumen  Un plan de alimentacin saludable lo ayudar a controlar la glucemia y mantener un estilo de vida saludable.  Trabajar con un especialista en dietas y nutricin (nutricionista) puede ayudarlo a elaborar el mejor plan de alimentacin para usted.  Tenga en cuenta que los carbohidratos (hidratos de carbono) y el alcohol tienen efectos inmediatos en sus niveles de glucemia. Es importante contar los carbohidratos que ingiere y consumir alcohol con prudencia. Esta informacin no tiene como fin reemplazar el consejo del mdico. Asegrese de hacerle al mdico cualquier pregunta que tenga. Document Revised: 04/23/2017 Document Reviewed: 12/03/2016 Elsevier Patient Education  2020 [employer]  

## 2019-12-17 ENCOUNTER — Inpatient Hospital Stay: Payer: Medicare HMO | Admitting: Hematology

## 2019-12-24 NOTE — Progress Notes (Signed)
HEMATOLOGY/ONCOLOGY CONSULTATION NOTE  Date of Service: 12/25/2019  Patient Care Team: Horald Pollen, MD as PCP - General (Internal Medicine)  CHIEF COMPLAINTS/PURPOSE OF CONSULTATION:  left neck mass concerning for lymphoma  HISTORY OF PRESENTING ILLNESS: see previous note  INTERVAL HISTORY  Damon Dudley is a wonderful 59 y.o. male who has been referred to [place] by Dr. Mitchel Honour for evaluation and management of lymphoma. Pt is accompanied today by [place] Ecologist. The patient's last visit with [place] was on 12/02/19. The pt reports that he is doing well overall.  The pt reports he is good. He had a little bit of pain where he had the biopsy, but it is not bothering him now. The mass in the neck has not gotten any bigger.   Of note since the patient's last visit, pt has had [place] Core Biopsy ([id number]) completed on 12/08/19 with results revealing "Status post ultrasound-guided biopsy of left neck mass." Pt has had PET Skull Base to Thigh ([id number]) completed on 12/10/19 with results revealing "1. Hypermetabolic left supraclavicular nodal mass,, as on 11/26/2019 and as biopsied on 12/08/2019. No evidence of additional hypermetabolic adenopathy in the neck, chest, abdomen or pelvis. 2. Nodular areas of hypermetabolism in the prostate. Malignancy cannot be excluded. 3. Cholelithiasis. 4. Aortic atherosclerosis (ICD10-I70.0). Coronary artery Calcification."  Lab results today (12/14/19) of CBC w/diff and CMP is as follows: all values are WNL except for Glucose at 112, Creatinine at 0.71, Albumin/Globulin Ratio at 2.9 12/14/19 of Lipid Panel is as follows: all values are WNL 12/14/19 of POCT Glycosylated Hemoglobin (Hb A1C) is as follows: all values are WNL except for: Hemoglobin A1C at 9.2 12/14/19 of POC Glucose at 114 12/02/19 of LDH at 171 12/02/19 of Sedimentation Rate at 4: WNL 12/02/19 of HIV Antibody at non-reactive 12/02/19 of Hepatitis B core antibody, total at  non-reactive 12/02/19 of Hepatitis B surface antigen at non-reactive 12/02/19 of Hepatitic C Antibody at non-reactive  On review of systems, pt denies abdominal pain and any other symptoms.   MEDICAL HISTORY:  Past Medical History:  Diagnosis Date  . Diabetes mellitus without complication ([place])   . Hypertension     SURGICAL HISTORY: No past surgical history on file.  SOCIAL HISTORY: Social History   Socioeconomic History  . Marital status: Married    Spouse name: Not on file  . Number of children: Not on file  . Years of education: Not on file  . Highest education level: Not on file  Occupational History  . Not on file  Tobacco Use  . Smoking status: Former Smoker    Quit date: 08/27/2010    Years since quitting: 9.3  . Smokeless tobacco: Never Used  Substance and Sexual Activity  . Alcohol use: Not Currently  . Drug use: Never  . Sexual activity: Not on file  Other Topics Concern  . Not on file  Social History Narrative  . Not on file   Social Determinants of Health   Financial Resource Strain:   . Difficulty of Paying Living Expenses:   Food Insecurity:   . Worried About Charity fundraiser in the Last Year:   . Arboriculturist in the Last Year:   Transportation Needs:   . Film/video editor (Medical):   Marland Kitchen Lack of Transportation (Non-Medical):   Physical Activity:   . Days of Exercise per Week:   . Minutes of Exercise per Session:   Stress:   . Feeling of Stress :  Social Connections:   . Frequency of Communication with Friends and Family:   . Frequency of Social Gatherings with Friends and Family:   . Attends Religious Services:   . Active Member of Clubs or Organizations:   . Attends Archivist Meetings:   Marland Kitchen Marital Status:   Intimate Partner Violence:   . Fear of Current or Ex-Partner:   . Emotionally Abused:   Marland Kitchen Physically Abused:   . Sexually Abused:     FAMILY HISTORY: No family history on file.  ALLERGIES:  has No Known  Allergies.  MEDICATIONS:  Current Outpatient Medications  Medication Sig Dispense Refill  . atorvastatin (LIPITOR) 40 MG tablet Take 1 tablet (40 mg total) by mouth daily. 90 tablet 3  . Dulaglutide (TRULICITY) A999333 0000000 SOPN Inject 0.75 mg into the skin once a week. 4 pen 5  . enalapril-hydrochlorothiazide (VASERETIC) 10-25 MG tablet Take 1 tablet by mouth daily. 90 tablet 3  . glipiZIDE (GLUCOTROL) 10 MG tablet Take 1 tablet (10 mg total) by mouth daily before breakfast. 90 tablet 3  . insulin degludec (TRESIBA FLEXTOUCH) 100 UNIT/ML SOPN FlexTouch Pen Inject 0.25 mLs (25 Units total) into the skin at bedtime. 5 pen 11  . metFORMIN (GLUCOPHAGE) 1000 MG tablet Take 1 tablet (1,000 mg total) by mouth 2 (two) times daily with a meal. 180 tablet 3  . neomycin-polymyxin-hydrocortisone (CORTISPORIN) OTIC solution Apply 1-2 drops to toe after soaking twice a day (Patient not taking: Reported on 12/14/2019) 10 mL 0   No current facility-administered medications for this visit.    REVIEW OF SYSTEMS:   A 10+ POINT REVIEW OF SYSTEMS WAS OBTAINED including neurology, dermatology, psychiatry, cardiac, respiratory, lymph, extremities, GI, GU, Musculoskeletal, constitutional, breasts, reproductive, HEENT.  All pertinent positives are noted in the HPI.  All others are negative.   PHYSICAL EXAMINATION: ECOG PERFORMANCE STATUS: 1 - Symptomatic but completely ambulatory  . Vitals:   12/25/19 1232  BP: 134/88  Pulse: 71  Resp: 18  Temp: 97.8 F (36.6 C)  SpO2: 100%   Filed Weights   12/25/19 1232  Weight: 192 lb 11.2 oz (87.4 kg)   .Body mass index is 33.08 kg/m.   GENERAL:alert, in no acute distress and comfortable SKIN: no acute rashes, no significant lesions EYES: conjunctiva are pink and non-injected, sclera anicteric OROPHARYNX: MMM, no exudates, no oropharyngeal erythema or ulceration NECK: supple, no JVD LYMPH:  no palpable lymphadenopathy in the axillary or inguinal regions,  palpable in cervical region  LUNGS: clear to auscultation b/l with normal respiratory effort HEART: regular rate & rhythm ABDOMEN:  normoactive bowel sounds , non tender, not distended. Extremity: no pedal edema PSYCH: alert & oriented x 3 with fluent speech NEURO: no focal motor/sensory deficits  LABORATORY DATA:  I have reviewed the data as listed  . CBC Latest Ref Rng & Units 12/14/2019 12/02/2019 11/17/2019  WBC 3.4 - 10.8 x10E3/uL 5.6 5.4 7.1  Hemoglobin 13.0 - 17.7 g/dL 15.0 14.3 14.2  Hematocrit 37.5 - 51.0 % 46.2 43.3 41.6  Platelets 150 - 450 x10E3/uL 265 280 345    . CMP Latest Ref Rng & Units 12/14/2019 12/02/2019 11/17/2019  Glucose 65 - 99 mg/dL 112(H) 159(H) 234(H)  BUN 8 - 27 mg/dL 12 16 15   Creatinine 0.76 - 1.27 mg/dL 0.71(L) 0.78 1.18  Sodium 134 - 144 mmol/L 139 142 142  Potassium 3.5 - 5.2 mmol/L 3.8 3.9 4.4  Chloride 96 - 106 mmol/L 99 102 100  CO2 20 -  29 mmol/L 22 31 27   Calcium 8.6 - 10.2 mg/dL 10.0 9.6 9.8  Total Protein 6.0 - 8.5 g/dL 6.3 6.8 6.4  Total Bilirubin 0.0 - 1.2 mg/dL 0.5 0.5 0.2  Alkaline Phos 39 - 117 IU/L 82 89 99  AST 0 - 40 IU/L 22 18 22   ALT 0 - 44 IU/L 26 26 29    . Lab Results  Component Value Date   LDH 171 12/02/2019     RADIOGRAPHIC STUDIES: I have personally reviewed the radiological images as listed and agreed with the findings in the report. CT Soft Tissue Neck W Contrast  Result Date: 11/26/2019 CLINICAL DATA:  Left-sided neck mass noted for the last 6 months. Painful, particularly with talking. EXAM: CT NECK WITH CONTRAST TECHNIQUE: Multidetector CT imaging of the neck was performed using the standard protocol following the bolus administration of intravenous contrast. CONTRAST:  25mL OMNIPAQUE IOHEXOL 300 MG/ML  SOLN COMPARISON:  None. FINDINGS: Pharynx and larynx: No mucosal or submucosal lesion is seen. Salivary glands: Parotid and submandibular glands are normal. Thyroid: Normal Lymph nodes: No lymphadenopathy on the right.  Lobular mass in the supraclavicular region on the left measuring 5 cm right to left, 3 cm front to back and 3.5 cm cephalo caudal. This is presumed to represent a lymph node mass. There is also some abnormal tissue that probably represents abnormal lymph nodes lower at the thoracic inlet on the left just lateral to the left lobe of the thyroid. This tissue is indistinct but measures in total about 2 cm in size and is presumed represent a manifestation of the same process. No other enlarged nodes by size criteria on the left. Vascular: Ordinary atherosclerotic change at the carotid bifurcations. Limited intracranial: Normal Visualized orbits: Normal Mastoids and visualized paranasal sinuses: Mucosal inflammatory changes of the left maxillary sinus. Skeleton: Mild cervical spondylosis. Upper chest: Negative. No superior mediastinal lymphadenopathy. Lung apices are clear. Other: None IMPRESSION: 5 x 3 x 3.5 cm lobular mass in the left supraclavicular region presumed to represent a conglomeration of pathologic lymph nodes. Second area of more indistinct but abnormal tissue lower in the supraclavicular region, at the thoracic inlet level, just lateral to the left lobe of the thyroid, measuring about 2 cm in size in total. Differential diagnosis is metastatic disease versus lymphoma. No evidence of superior mediastinal lymphadenopathy. No evidence of mucosal primary tumor. Electronically Signed   By: Nelson Chimes M.D.   On: 11/26/2019 20:01   NM PET Image Initial (PI) Skull Base To Thigh  Result Date: 12/11/2019 CLINICAL DATA:  Initial treatment strategy for cervical lymphadenopathy. EXAM: NUCLEAR MEDICINE PET SKULL BASE TO THIGH TECHNIQUE: 9.8 mCi F-18 FDG was injected intravenously. Full-ring PET imaging was performed from the skull base to thigh after the radiotracer. CT data was obtained and used for attenuation correction and anatomic localization. Fasting blood glucose: 123 mg/dl COMPARISON:  CT neck 11/26/2019.  FINDINGS: Mediastinal blood pool activity: SUV max 2.4 Liver activity: SUV max 4.3 NECK: Conglomerate left supraclavicular nodal mass measures 3.5 x 3.5 cm (4/42) an SUV max of 13.7. No additional hypermetabolic lymph nodes. Incidental CT findings: None. CHEST: Low left internal jugular lymph nodes are subcentimeter in short axis size and do not show hypermetabolism above blood pool. No hypermetabolic mediastinal, hilar or axillary lymph nodes. No hypermetabolic pulmonary nodules. Incidental CT findings: Atherosclerotic calcification of the aorta and coronary arteries. Heart is mildly enlarged. No pericardial or pleural effusion. 3 mm nodule adjacent to the right major fissure (  8/37), too small for PET resolution. ABDOMEN/PELVIS: No abnormal hypermetabolism in the liver, adrenal glands, spleen or pancreas. No hypermetabolic lymph nodes. Nodular areas of hypermetabolism in the prostate. Focal hypermetabolism in the subcutaneous fat overlying the right iliac wing may be iatrogenic in etiology. Incidental CT findings: Liver is unremarkable. A stone is seen in the gallbladder. Adrenal glands are unremarkable. Renal vascular calcifications bilaterally. Spleen is normal in size. Pancreas, stomach and bowel are grossly unremarkable. Atherosclerotic calcification of the aorta without abdominal aortic aneurysm. Common iliac arteries are ectatic bilaterally, measuring 1.7 cm on the right and 1.9 cm on left. Prostate is mildly enlarged. SKELETON: No abnormal osseous hypermetabolism. Incidental CT findings: Degenerative changes in the spine. IMPRESSION: 1. Hypermetabolic left supraclavicular nodal mass,, as on 11/26/2019 and as biopsied on 12/08/2019. No evidence of additional hypermetabolic adenopathy in the neck, chest, abdomen or pelvis. 2. Nodular areas of hypermetabolism in the prostate. Malignancy cannot be excluded. 3. Cholelithiasis. 4. Aortic atherosclerosis (ICD10-I70.0). Coronary artery calcification. Electronically  Signed   By: Lorin Picket M.D.   On: 12/11/2019 09:45   [place] CORE BIOPSY (LYMPH NODES)  Result Date: 12/08/2019 INDICATION: 59 year old male with a history of left neck mass concerning for lymphoma EXAM: IMAGE GUIDED BIOPSY OF LEFT NECK MASS MEDICATIONS: None. ANESTHESIA/SEDATION: None FLUOROSCOPY TIME:  None COMPLICATIONS: None PROCEDURE: Informed written consent was obtained from the patient after a thorough discussion of the procedural risks, benefits and alternatives. All questions were addressed. Maximal Sterile Barrier Technique was utilized including caps, mask, sterile gowns, sterile gloves, sterile drape, hand hygiene and skin antiseptic. A timeout was performed prior to the initiation of the procedure. Ultrasound survey was performed with images stored and sent to PACs. The left neck was prepped with chlorhexidine in a sterile fashion, and a sterile drape was applied covering the operative field. A sterile gown and sterile gloves were used for the procedure. Local anesthesia was provided with 1% Lidocaine. Ultrasound guidance was used to infiltrate the region with 1% lidocaine for local anesthesia. Multiple separate 18 gauge core biopsy were then acquired of the left neck mass using ultrasound guidance. Images were stored. Final image was stored after biopsy. Patient tolerated the procedure well and remained hemodynamically stable throughout. No complications were encountered and no significant blood loss was encounter IMPRESSION: Status post ultrasound-guided biopsy of left neck mass. Signed, Dulcy Fanny. Dellia Nims, RPVI [facility] [place] [facility] Electronically Signed   By: Corrie Mckusick D.O.   On: 12/08/2019 14:06   12/08/19 Surgical Pathology Report ([id number])     ASSESSMENT & PLAN:   59 yo male with   1) Large progressive left neck mass consistent with lymphadenopathy. Bx-- concerning for Non hodgkins lymphoma - indeterminate  subtype.  PLAN: -Discussed pt labwork today, 12/14/19; of CBC w/diff and CMP is as follows: all values are WNL except for Glucose at 112, Creatinine at 0.71, Albumin/Globulin Ratio at 2.9 -Discussed 12/14/19 of Lipid Panel is as follows: all values are WNL -Discussed 12/14/19 of POCT Glycosylated Hemoglobin (Hb A1C) is as follows: all values are WNL except for: Hemoglobin A1C at 9.2 -Discussed 12/14/19 of POC Glucose at 114 -Discussed 12/02/19 of 12/02/19 of LDH at 171 -Discussed 12/02/19 of Sedimentation Rate at 4: WNL -Discussed 12/02/19 of HIV Antibody at non-reactive -Discussed 12/02/19 of Hepatitis B core antibody, total at non-reactive -Discussed 12/02/19 of Hepatitis B surface antigen at non-reactive -Discussed 12/02/19 of Hepatitic C Antibody at non-reactive -Discussed 12/08/19 of [place] Core Biopsy ([id number]) -Discussed 12/10/19  of PET Skull Base to Thigh ([id number]) -Advised pt that he has several masses, one much larger than the others, that appear to be originating from a lymph gland -Advised pt that this is a slow-growing mass. Does not suggest an aggressive process. 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 -F/u with PCP -will recommend PCP consider urology consultation to r/o prostate cancer -Will see back in 3 weeks   FOLLOW UP: Referral to Dr Radene Journey (ENT) for cervical LN biopsy in 1 week RTC with Dr Irene Limbo in 3 weeks  . Orders Placed This Encounter  Procedures  . Ambulatory referral to ENT    Referral Priority:   Urgent     Referral Type:   Consultation    Referral Reason:   Specialty Services Required    Referred to Provider:   Rozetta Nunnery, MD    Requested Specialty:   Otolaryngology    Number of Visits Requested:   1    The total time spent in the appt was 30 minutes and more than 50% was on counseling and direct patient cares.  All of the patient's questions were answered with apparent satisfaction. The patient knows to call the clinic with any problems, questions or concerns.  Sullivan Lone MD MS AAHIVMS [facility] Hematology/Oncology Physician [facility]  (Office):       [phone] (Work cell):  [phone] (Fax):           [phone]  12/25/2019 1:01 PM  I, Dawayne Cirri am acting as a Education administrator for Dr. Sullivan Lone.   .I have reviewed the above documentation for accuracy and completeness, and I agree with the above. Brunetta Genera MD

## 2019-12-25 ENCOUNTER — Inpatient Hospital Stay: Payer: Medicare HMO | Admitting: Hematology

## 2019-12-25 ENCOUNTER — Telehealth: Payer: Self-pay | Admitting: Hematology

## 2019-12-25 ENCOUNTER — Other Ambulatory Visit: Payer: Self-pay

## 2019-12-25 VITALS — BP 134/88 | HR 71 | Temp 97.8°F | Resp 18 | Ht 64.0 in | Wt 192.7 lb

## 2019-12-25 DIAGNOSIS — R221 Localized swelling, mass and lump, neck: Secondary | ICD-10-CM | POA: Diagnosis not present

## 2019-12-25 DIAGNOSIS — C8591 Non-Hodgkin lymphoma, unspecified, lymph nodes of head, face, and neck: Secondary | ICD-10-CM | POA: Diagnosis not present

## 2019-12-25 DIAGNOSIS — R590 Localized enlarged lymph nodes: Secondary | ICD-10-CM | POA: Diagnosis not present

## 2019-12-25 NOTE — Telephone Encounter (Signed)
Scheduled appt per 4/30 los. Printed calendar and avs

## 2020-01-13 ENCOUNTER — Other Ambulatory Visit: Payer: Self-pay

## 2020-01-13 ENCOUNTER — Ambulatory Visit (INDEPENDENT_AMBULATORY_CARE_PROVIDER_SITE_OTHER): Payer: Medicare HMO | Admitting: Otolaryngology

## 2020-01-13 ENCOUNTER — Telehealth: Payer: Self-pay | Admitting: Hematology

## 2020-01-13 VITALS — Temp 97.5°F

## 2020-01-13 DIAGNOSIS — R591 Generalized enlarged lymph nodes: Secondary | ICD-10-CM | POA: Diagnosis not present

## 2020-01-13 NOTE — Progress Notes (Signed)
HPI: Damon Dudley is a 59 y.o. male who presents is referred by Dr. Irene Limbo For evaluation of enlarged left lower neck node.  Patient has noted this this for about 9 months and is gradually gotten little bit larger.  He underwent core needle aspirate last week that demonstrated findings consistent with probable non-Hodgkin's B-cell lymphoma.  However this was nondiagnostic for the specific lymphoma and excisional biopsy was recommended and patient was referred here to have excisional biopsy of left lower neck node. Patient with history of diabetes and hypertension.  He is otherwise healthy.  Past Medical History:  Diagnosis Date  . Diabetes mellitus without complication ([place])   . Hypertension    No past surgical history on file. Social History   Socioeconomic History  . Marital status: Married    Spouse name: Not on file  . Number of children: Not on file  . Years of education: Not on file  . Highest education level: Not on file  Occupational History  . Not on file  Tobacco Use  . Smoking status: Former Smoker    Quit date: 08/27/2010    Years since quitting: 9.3  . Smokeless tobacco: Never Used  Substance and Sexual Activity  . Alcohol use: Not Currently  . Drug use: Never  . Sexual activity: Not on file  Other Topics Concern  . Not on file  Social History Narrative  . Not on file   Social Determinants of Health   Financial Resource Strain:   . Difficulty of Paying Living Expenses:   Food Insecurity:   . Worried About Charity fundraiser in the Last Year:   . Arboriculturist in the Last Year:   Transportation Needs:   . Film/video editor (Medical):   Marland Kitchen Lack of Transportation (Non-Medical):   Physical Activity:   . Days of Exercise per Week:   . Minutes of Exercise per Session:   Stress:   . Feeling of Stress :   Social Connections:   . Frequency of Communication with Friends and Family:   . Frequency of Social Gatherings with Friends and Family:   . Attends  Religious Services:   . Active Member of Clubs or Organizations:   . Attends Archivist Meetings:   Marland Kitchen Marital Status:    No family history on file. No Known Allergies Prior to Admission medications   Medication Sig Start Date End Date Taking? Authorizing Provider  atorvastatin (LIPITOR) 40 MG tablet Take 1 tablet (40 mg total) by mouth daily. 06/01/19 08/30/19  Horald Pollen, MD  Dulaglutide (TRULICITY) A999333 0000000 SOPN Inject 0.75 mg into the skin once a week. 12/14/19   Horald Pollen, MD  enalapril-hydrochlorothiazide (VASERETIC) 10-25 MG tablet Take 1 tablet by mouth daily. 06/01/19   Horald Pollen, MD  glipiZIDE (GLUCOTROL) 10 MG tablet Take 1 tablet (10 mg total) by mouth daily before breakfast. 12/14/19   Sagardia, Ines Bloomer, MD  insulin degludec (TRESIBA FLEXTOUCH) 100 UNIT/ML SOPN FlexTouch Pen Inject 0.25 mLs (25 Units total) into the skin at bedtime. 10/19/19   Horald Pollen, MD  metFORMIN (GLUCOPHAGE) 1000 MG tablet Take 1 tablet (1,000 mg total) by mouth 2 (two) times daily with a meal. 06/01/19 08/30/19  Sagardia, Ines Bloomer, MD  neomycin-polymyxin-hydrocortisone (CORTISPORIN) OTIC solution Apply 1-2 drops to toe after soaking twice a day Patient not taking: Reported on 12/14/2019 06/03/19   Wallene Huh, DPM     Positive ROS: Otherwise negative  All  other systems have been reviewed and were otherwise negative with the exception of those mentioned in the HPI and as above.  Physical Exam: Constitutional: Alert, well-appearing, no acute distress Ears: External ears without lesions or tenderness. Ear canals are clear bilaterally with intact, clear TMs.  Nasal: External nose without lesions. Septum with moderate deviation.. Clear nasal passages otherwise. Oral: Lips and gums without lesions. Tongue and palate mucosa without lesions. Posterior oropharynx clear. Neck: Patient has an enlarged easily palpable left posterior neck node just behind  the sternocleidomastoid muscle.  Measures approximately 3 to 4 cm in size. Respiratory: Breathing comfortably  Skin: No facial/neck lesions or rash noted.  Procedures  Assessment: Enlarged left neck lymphadenopathy consistent with probable lymphoma.  Plan: Discussed excisional biopsy of the lymph node under anesthesia with the patient in the office today and we will plan on scheduling this in the next week or 2.   Radene Journey, MD   CC:

## 2020-01-13 NOTE — Telephone Encounter (Signed)
Called pt per 5/19 sch message - unable to reach pt . Left message for patient to call back to reschedule.

## 2020-01-15 ENCOUNTER — Inpatient Hospital Stay: Payer: Medicare HMO | Admitting: Hematology

## 2020-01-20 ENCOUNTER — Encounter (HOSPITAL_BASED_OUTPATIENT_CLINIC_OR_DEPARTMENT_OTHER): Payer: Self-pay | Admitting: Otolaryngology

## 2020-01-20 ENCOUNTER — Other Ambulatory Visit: Payer: Self-pay

## 2020-01-26 ENCOUNTER — Other Ambulatory Visit (HOSPITAL_COMMUNITY)
Admission: RE | Admit: 2020-01-26 | Discharge: 2020-01-26 | Disposition: A | Discharge status: Home / Self Care | Payer: Medicare HMO | Source: Ambulatory Visit | Attending: Otolaryngology | Admitting: Otolaryngology

## 2020-01-26 ENCOUNTER — Encounter (HOSPITAL_BASED_OUTPATIENT_CLINIC_OR_DEPARTMENT_OTHER)
Admission: RE | Admit: 2020-01-26 | Discharge: 2020-01-26 | Disposition: A | Discharge status: Home / Self Care | Payer: Medicare HMO | Source: Ambulatory Visit | Attending: Otolaryngology | Admitting: Otolaryngology

## 2020-01-26 DIAGNOSIS — Z20822 Contact with and (suspected) exposure to covid-19: Secondary | ICD-10-CM | POA: Insufficient documentation

## 2020-01-26 DIAGNOSIS — Z01812 Encounter for preprocedural laboratory examination: Secondary | ICD-10-CM | POA: Insufficient documentation

## 2020-01-26 LAB — BASIC METABOLIC PANEL
Anion gap: 8 (ref 5–15)
BUN: 14 mg/dL (ref 8–23)
CO2: 30 mmol/L (ref 22–32)
Calcium: 9.6 mg/dL (ref 8.9–10.3)
Chloride: 100 mmol/L (ref 98–111)
Creatinine, Ser: 0.61 mg/dL (ref 0.61–1.24)
GFR calc Af Amer: >60 mL/min (ref >60)
GFR calc non Af Amer: >60 mL/min (ref >60)
Glucose, Bld: 96 mg/dL (ref 70–99)
Potassium: 4.6 mmol/L (ref 3.5–5.1)
Sodium: 138 mmol/L (ref 135–145)

## 2020-01-26 LAB — SARS CORONAVIRUS 2 (TAT 6-24 HRS): SARS Coronavirus 2: NEGATIVE

## 2020-01-26 NOTE — Progress Notes (Signed)
Reviewed EKG with Dr. Valma Cava, anesthesiologist at [facility]. Okay to proceed with surgery as scheduled.

## 2020-01-28 ENCOUNTER — Ambulatory Visit (INDEPENDENT_AMBULATORY_CARE_PROVIDER_SITE_OTHER): Payer: Self-pay | Admitting: Otolaryngology

## 2020-01-28 DIAGNOSIS — C8511 Unspecified B-cell lymphoma, lymph nodes of head, face, and neck: Secondary | ICD-10-CM
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
## 2020-01-29 ENCOUNTER — Encounter (HOSPITAL_BASED_OUTPATIENT_CLINIC_OR_DEPARTMENT_OTHER)
Admission: RE | Disposition: A | Discharge status: Home / Self Care | Payer: Self-pay | Source: Home / Self Care | Attending: Otolaryngology

## 2020-01-29 ENCOUNTER — Other Ambulatory Visit: Payer: Self-pay

## 2020-01-29 ENCOUNTER — Ambulatory Visit (HOSPITAL_BASED_OUTPATIENT_CLINIC_OR_DEPARTMENT_OTHER): Payer: Medicare HMO | Admitting: Anesthesiology

## 2020-01-29 ENCOUNTER — Encounter (HOSPITAL_BASED_OUTPATIENT_CLINIC_OR_DEPARTMENT_OTHER): Payer: Self-pay | Admitting: Otolaryngology

## 2020-01-29 ENCOUNTER — Ambulatory Visit (HOSPITAL_BASED_OUTPATIENT_CLINIC_OR_DEPARTMENT_OTHER)
Admission: RE | Admit: 2020-01-29 | Discharge: 2020-01-29 | Disposition: A | Discharge status: Home / Self Care | Payer: Medicare HMO | Attending: Otolaryngology | Admitting: Otolaryngology

## 2020-01-29 DIAGNOSIS — Z87891 Personal history of nicotine dependence: Secondary | ICD-10-CM | POA: Diagnosis not present

## 2020-01-29 DIAGNOSIS — Z794 Long term (current) use of insulin: Secondary | ICD-10-CM | POA: Diagnosis not present

## 2020-01-29 DIAGNOSIS — C8511 Unspecified B-cell lymphoma, lymph nodes of head, face, and neck: Secondary | ICD-10-CM

## 2020-01-29 DIAGNOSIS — R591 Generalized enlarged lymph nodes: Secondary | ICD-10-CM

## 2020-01-29 DIAGNOSIS — Z79899 Other long term (current) drug therapy: Secondary | ICD-10-CM | POA: Diagnosis not present

## 2020-01-29 DIAGNOSIS — E1165 Type 2 diabetes mellitus with hyperglycemia: Secondary | ICD-10-CM | POA: Diagnosis not present

## 2020-01-29 DIAGNOSIS — D47Z9 Other specified neoplasms of uncertain behavior of lymphoid, hematopoietic and related tissue: Secondary | ICD-10-CM | POA: Insufficient documentation

## 2020-01-29 DIAGNOSIS — I10 Essential (primary) hypertension: Secondary | ICD-10-CM | POA: Diagnosis not present

## 2020-01-29 DIAGNOSIS — R590 Localized enlarged lymph nodes: Secondary | ICD-10-CM | POA: Diagnosis present

## 2020-01-29 DIAGNOSIS — D487 Neoplasm of uncertain behavior of other specified sites: Secondary | ICD-10-CM

## 2020-01-29 HISTORY — PX: MASS BIOPSY: SHX5445

## 2020-01-29 LAB — GLUCOSE, CAPILLARY
Glucose-Capillary: 108 mg/dL — ABNORMAL HIGH (ref 70–99)
Glucose-Capillary: 108 mg/dL — ABNORMAL HIGH (ref 70–99)

## 2020-01-29 SURGERY — BIOPSY, MASS, NECK
Anesthesia: General | Site: Neck | Laterality: Left

## 2020-01-29 MED ORDER — CHLORHEXIDINE GLUCONATE CLOTH 2 % EX PADS: 6.0000 | MEDICATED_PAD | Freq: Once | CUTANEOUS | Status: DC

## 2020-01-29 MED ORDER — DEXAMETHASONE SODIUM PHOSPHATE 4 MG/ML IJ SOLN
INTRAMUSCULAR | Status: DC | PRN
  Administered 2020-01-29: 5 mg via INTRAVENOUS

## 2020-01-29 MED ORDER — CEFAZOLIN SODIUM-DEXTROSE 2-4 GM/100ML-% IV SOLN
INTRAVENOUS | Status: AC
  Filled 2020-01-29: qty 100

## 2020-01-29 MED ORDER — PHENYLEPHRINE 40 MCG/ML (10ML) SYRINGE FOR IV PUSH (FOR BLOOD PRESSURE SUPPORT)
PREFILLED_SYRINGE | INTRAVENOUS | Status: AC
  Filled 2020-01-29: qty 10

## 2020-01-29 MED ORDER — ONDANSETRON HCL 4 MG/2ML IJ SOLN
INTRAMUSCULAR | Status: DC | PRN
  Administered 2020-01-29: 4 mg via INTRAVENOUS

## 2020-01-29 MED ORDER — SUCCINYLCHOLINE CHLORIDE 200 MG/10ML IV SOSY
PREFILLED_SYRINGE | INTRAVENOUS | Status: AC
  Filled 2020-01-29: qty 10

## 2020-01-29 MED ORDER — MIDAZOLAM HCL 2 MG/2ML IJ SOLN
INTRAMUSCULAR | Status: AC
  Filled 2020-01-29: qty 2

## 2020-01-29 MED ORDER — BACITRACIN ZINC 500 UNIT/GM EX OINT
TOPICAL_OINTMENT | CUTANEOUS | Status: AC
  Filled 2020-01-29: qty 28.35

## 2020-01-29 MED ORDER — PROPOFOL 500 MG/50ML IV EMUL
INTRAVENOUS | Status: AC
  Filled 2020-01-29: qty 50

## 2020-01-29 MED ORDER — EPHEDRINE 5 MG/ML INJ
INTRAVENOUS | Status: AC
  Filled 2020-01-29: qty 10

## 2020-01-29 MED ORDER — PROPOFOL 10 MG/ML IV BOLUS
INTRAVENOUS | Status: DC | PRN
  Administered 2020-01-29: 200 mg via INTRAVENOUS

## 2020-01-29 MED ORDER — EPINEPHRINE PF 1 MG/ML IJ SOLN
INTRAMUSCULAR | Status: AC
  Filled 2020-01-29: qty 1

## 2020-01-29 MED ORDER — MIDAZOLAM HCL 5 MG/5ML IJ SOLN
INTRAMUSCULAR | Status: DC | PRN
  Administered 2020-01-29: 2 mg via INTRAVENOUS

## 2020-01-29 MED ORDER — KETOROLAC TROMETHAMINE 30 MG/ML IJ SOLN: 30.0000 mg | Freq: Once | INTRAMUSCULAR | Status: DC | PRN

## 2020-01-29 MED ORDER — DEXAMETHASONE SODIUM PHOSPHATE 10 MG/ML IJ SOLN
INTRAMUSCULAR | Status: AC
  Filled 2020-01-29: qty 1

## 2020-01-29 MED ORDER — DIPHENHYDRAMINE HCL 50 MG/ML IJ SOLN
INTRAMUSCULAR | Status: DC | PRN
  Administered 2020-01-29: 6.25 mg via INTRAVENOUS

## 2020-01-29 MED ORDER — FENTANYL CITRATE (PF) 100 MCG/2ML IJ SOLN
INTRAMUSCULAR | Status: DC | PRN
  Administered 2020-01-29: 100 ug via INTRAVENOUS

## 2020-01-29 MED ORDER — BACITRACIN ZINC 500 UNIT/GM EX OINT
TOPICAL_OINTMENT | CUTANEOUS | Status: AC
  Filled 2020-01-29: qty 0.9

## 2020-01-29 MED ORDER — LACTATED RINGERS IV SOLN: INTRAVENOUS | Status: DC

## 2020-01-29 MED ORDER — LIDOCAINE-EPINEPHRINE 1 %-1:100000 IJ SOLN
INTRAMUSCULAR | Status: DC | PRN
  Administered 2020-01-29: 4 mL via INTRADERMAL

## 2020-01-29 MED ORDER — BUPIVACAINE HCL (PF) 0.25 % IJ SOLN
INTRAMUSCULAR | Status: AC
  Filled 2020-01-29: qty 30

## 2020-01-29 MED ORDER — LIDOCAINE HCL (CARDIAC) PF 100 MG/5ML IV SOSY
PREFILLED_SYRINGE | INTRAVENOUS | Status: DC | PRN
  Administered 2020-01-29: 80 mg via INTRAVENOUS

## 2020-01-29 MED ORDER — FENTANYL CITRATE (PF) 100 MCG/2ML IJ SOLN: 25.0000 ug | INTRAMUSCULAR | Status: DC | PRN

## 2020-01-29 MED ORDER — DIPHENHYDRAMINE HCL 50 MG/ML IJ SOLN
INTRAMUSCULAR | Status: AC
  Filled 2020-01-29: qty 1

## 2020-01-29 MED ORDER — CEFAZOLIN SODIUM-DEXTROSE 2-4 GM/100ML-% IV SOLN
2.0000 g | INTRAVENOUS | Status: AC
  Administered 2020-01-29: 2 g via INTRAVENOUS

## 2020-01-29 MED ORDER — LIDOCAINE-EPINEPHRINE 1 %-1:100000 IJ SOLN
INTRAMUSCULAR | Status: AC
  Filled 2020-01-29: qty 1

## 2020-01-29 MED ORDER — LIDOCAINE 2% (20 MG/ML) 5 ML SYRINGE
INTRAMUSCULAR | Status: AC
  Filled 2020-01-29: qty 5

## 2020-01-29 MED ORDER — ONDANSETRON HCL 4 MG/2ML IJ SOLN
INTRAMUSCULAR | Status: AC
  Filled 2020-01-29: qty 2

## 2020-01-29 MED ORDER — FENTANYL CITRATE (PF) 100 MCG/2ML IJ SOLN
INTRAMUSCULAR | Status: AC
  Filled 2020-01-29: qty 2

## 2020-01-29 SURGICAL SUPPLY — 64 items
ATTRACTOMAT 16X20 MAGNETIC DRP (DRAPES) ×3 IMPLANT
BENZOIN TINCTURE PRP APPL 2/3 (GAUZE/BANDAGES/DRESSINGS) IMPLANT
BLADE SURG 15 STRL LF DISP TIS (BLADE) ×1 IMPLANT
BLADE SURG 15 STRL SS (BLADE) ×2
CANISTER SUCT 1200ML W/VALVE (MISCELLANEOUS) ×3 IMPLANT
CLEANER CAUTERY TIP 5X5 PAD (MISCELLANEOUS) IMPLANT
CLOSURE WOUND 1/2 X4 (GAUZE/BANDAGES/DRESSINGS)
CLOSURE WOUND 1/4X4 (GAUZE/BANDAGES/DRESSINGS)
CORD BIPOLAR FORCEPS 12FT (ELECTRODE) ×3 IMPLANT
COVER BACK TABLE 60X90IN (DRAPES) ×3 IMPLANT
COVER MAYO STAND STRL (DRAPES) ×3 IMPLANT
COVER WAND RF STERILE (DRAPES) IMPLANT
DECANTER SPIKE VIAL GLASS SM (MISCELLANEOUS) IMPLANT
DERMABOND ADVANCED (GAUZE/BANDAGES/DRESSINGS)
DERMABOND ADVANCED .7 DNX12 (GAUZE/BANDAGES/DRESSINGS) IMPLANT
DRAPE U-SHAPE 76X120 STRL (DRAPES) ×3 IMPLANT
ELECT COATED BLADE 2.86 ST (ELECTRODE) ×3 IMPLANT
ELECT REM PT RETURN 9FT ADLT (ELECTROSURGICAL) ×3
ELECTRODE REM PT RTRN 9FT ADLT (ELECTROSURGICAL) ×1 IMPLANT
GAUZE 4X4 16PLY RFD (DISPOSABLE) IMPLANT
GAUZE SPONGE 4X4 12PLY STRL LF (GAUZE/BANDAGES/DRESSINGS) IMPLANT
GLOVE BIO SURGEON STRL SZ 6.5 (GLOVE) ×4 IMPLANT
GLOVE BIO SURGEONS STRL SZ 6.5 (GLOVE) ×2
GLOVE BIOGEL PI IND STRL 6.5 (GLOVE) ×1 IMPLANT
GLOVE BIOGEL PI IND STRL 7.0 (GLOVE) ×1 IMPLANT
GLOVE BIOGEL PI INDICATOR 6.5 (GLOVE) ×2
GLOVE BIOGEL PI INDICATOR 7.0 (GLOVE) ×2
GLOVE SS BIOGEL STRL SZ 7.5 (GLOVE) ×1 IMPLANT
GLOVE SUPERSENSE BIOGEL SZ 7.5 (GLOVE) ×2
GOWN STRL REUS W/ TWL LRG LVL3 (GOWN DISPOSABLE) ×1 IMPLANT
GOWN STRL REUS W/TWL LRG LVL3 (GOWN DISPOSABLE) ×2
HEMOSTAT SURGICEL .5X2 ABSORB (HEMOSTASIS) IMPLANT
LOCATOR NERVE 3 VOLT (DISPOSABLE) IMPLANT
NEEDLE HYPO 25X1 1.5 SAFETY (NEEDLE) ×3 IMPLANT
NS IRRIG 1000ML POUR BTL (IV SOLUTION) ×3 IMPLANT
PAD CLEANER CAUTERY TIP 5X5 (MISCELLANEOUS)
PENCIL SMOKE EVACUATOR (MISCELLANEOUS) ×3 IMPLANT
SET BASIN DAY SURGERY F.S. (CUSTOM PROCEDURE TRAY) ×3 IMPLANT
SLEEVE SCD COMPRESS KNEE MED (MISCELLANEOUS) ×3 IMPLANT
SPONGE INTESTINAL PEANUT (DISPOSABLE) IMPLANT
STRIP CLOSURE SKIN 1/2X4 (GAUZE/BANDAGES/DRESSINGS) IMPLANT
STRIP CLOSURE SKIN 1/4X4 (GAUZE/BANDAGES/DRESSINGS) IMPLANT
SUCTION FRAZIER HANDLE 10FR (MISCELLANEOUS)
SUCTION TUBE FRAZIER 10FR DISP (MISCELLANEOUS) IMPLANT
SUT CHROMIC 3 0 PS 2 (SUTURE) ×3 IMPLANT
SUT CHROMIC 3 0 SH 27 (SUTURE) IMPLANT
SUT ETHILON 4 0 PS 2 18 (SUTURE) IMPLANT
SUT ETHILON 5 0 P 3 18 (SUTURE) ×2
SUT NYLON ETHILON 5-0 P-3 1X18 (SUTURE) ×1 IMPLANT
SUT SILK 2 0 TIES 17X18 (SUTURE)
SUT SILK 2-0 18XBRD TIE BLK (SUTURE) IMPLANT
SUT SILK 3 0 SH 30 (SUTURE) IMPLANT
SUT SILK 3 0 TIES 17X18 (SUTURE) ×2
SUT SILK 3-0 18XBRD TIE BLK (SUTURE) ×1 IMPLANT
SUT VIC AB 5-0 P-3 18X BRD (SUTURE) ×1 IMPLANT
SUT VIC AB 5-0 P3 18 (SUTURE) ×2
SWAB COLLECTION DEVICE MRSA (MISCELLANEOUS) IMPLANT
SWAB CULTURE ESWAB REG 1ML (MISCELLANEOUS) IMPLANT
SYR BULB EAR ULCER 3OZ GRN STR (SYRINGE) ×3 IMPLANT
SYR CONTROL 10ML LL (SYRINGE) ×3 IMPLANT
TOWEL GREEN STERILE FF (TOWEL DISPOSABLE) ×6 IMPLANT
TRAY DSU PREP LF (CUSTOM PROCEDURE TRAY) ×3 IMPLANT
TUBE CONNECTING 20'X1/4 (TUBING) ×1
TUBE CONNECTING 20X1/4 (TUBING) ×2 IMPLANT

## 2020-01-29 NOTE — Discharge Instructions (Signed)
Elevate head and apply cool compress to incision site for the next 12 hrs to reduce swelling and pain. Keep incision site dry for 24 hrs and then may take a shower and get it wet No heavy lifting for the next 3 days Tylenol or ibuprofen prn pain Return to see Dr Lucia Gaskins next Friday at [facility] Instructions  Activity: Get plenty of rest for the remainder of the day. A responsible individual must stay with you for 24 hours following the procedure.  For the next 24 hours, DO NOT: -Drive a car -Paediatric nurse -Drink alcoholic beverages -Take any medication unless instructed by your physician -Make any legal decisions or sign important papers.  Meals: Start with liquid foods such as gelatin or soup. Progress to regular foods as tolerated. Avoid greasy, spicy, heavy foods. If nausea and/or vomiting occur, drink only clear liquids until the nausea and/or vomiting subsides. Call your physician if vomiting continues.  Special Instructions/Symptoms: Your throat may feel dry or sore from the anesthesia or the breathing tube placed in your throat during surgery. If this causes discomfort, gargle with warm salt water. The discomfort should disappear within 24 hours.  If you had a scopolamine patch placed behind your ear for the management of post- operative nausea and/or vomiting:  1. The medication in the patch is effective for 72 hours, after which it should be removed.  Wrap patch in a tissue and discard in the trash. Wash hands thoroughly with soap and water. 2. You may remove the patch earlier than 72 hours if you experience unpleasant side effects which may include dry mouth, dizziness or visual disturbances. 3. Avoid touching the patch. Wash your hands with soap and water after contact with the patch.

## 2020-01-29 NOTE — Anesthesia Postprocedure Evaluation (Signed)
Anesthesia Post Note  Patient: Damon Dudley  Procedure(s) Performed: EXCISIONAL BIOPSY OF LEFT NECK MASS (Left Neck)     Patient location during evaluation: PACU Anesthesia Type: General Level of consciousness: awake and alert Pain management: pain level controlled Vital Signs Assessment: post-procedure vital signs reviewed and stable Respiratory status: spontaneous breathing, nonlabored ventilation, respiratory function stable and patient connected to nasal cannula oxygen Cardiovascular status: blood pressure returned to baseline and stable Postop Assessment: no apparent nausea or vomiting Anesthetic complications: no    Last Vitals:  Vitals:   01/29/20 0915 01/29/20 0930  BP: 136/83 134/82  Pulse: 70 74  Resp: 12 11  Temp:    SpO2: 96% 95%    Last Pain:  Vitals:   01/29/20 0915  TempSrc:   PainSc: 0-No pain                 Charniece Venturino S

## 2020-01-29 NOTE — Anesthesia Preprocedure Evaluation (Signed)
Anesthesia Evaluation  Patient identified by MRN, date of birth, ID band Patient awake    Reviewed: Allergy & Precautions, NPO status , Patient's Chart, lab work & pertinent test results  Airway Mallampati: II  TM Distance: >3 FB Neck ROM: Full    Dental no notable dental hx.    Pulmonary neg pulmonary ROS, former smoker,    Pulmonary exam normal breath sounds clear to auscultation       Cardiovascular hypertension, Normal cardiovascular exam Rhythm:Regular Rate:Normal     Neuro/Psych negative neurological ROS  negative psych ROS   GI/Hepatic negative GI ROS, Neg liver ROS,   Endo/Other  diabetes, Insulin Dependent  Renal/GU negative Renal ROS  negative genitourinary   Musculoskeletal negative musculoskeletal ROS (+)   Abdominal   Peds negative pediatric ROS (+)  Hematology negative hematology ROS (+)   Anesthesia Other Findings   Reproductive/Obstetrics negative OB ROS                             Anesthesia Physical Anesthesia Plan  ASA: III  Anesthesia Plan: General   Post-op Pain Management:    Induction: Intravenous  PONV Risk Score and Plan: 2 and Ondansetron, Dexamethasone and Treatment may vary due to age or medical condition  Airway Management Planned: LMA and Oral ETT  Additional Equipment:   Intra-op Plan:   Post-operative Plan: Extubation in OR  Informed Consent: I have reviewed the patients History and Physical, chart, labs and discussed the procedure including the risks, benefits and alternatives for the proposed anesthesia with the patient or authorized representative who has indicated his/her understanding and acceptance.     Dental advisory given  Plan Discussed with: CRNA and Surgeon  Anesthesia Plan Comments:         Anesthesia Quick Evaluation

## 2020-01-29 NOTE — Brief Op Note (Signed)
01/29/2020  8:55 AM  PATIENT:  Damon Dudley  59 y.o. male  PRE-OPERATIVE DIAGNOSIS:  ENLARGED NECK MASS  POST-OPERATIVE DIAGNOSIS:  ENLARGED NECK MASS  PROCEDURE:  Procedure(s): EXCISIONAL BIOPSY OF LEFT NECK MASS (Left)  SURGEON:  Surgeon(s) and Role:    Rozetta Nunnery, MD - Primary  PHYSICIAN ASSISTANT:   ASSISTANTS: none   ANESTHESIA:   general  EBL:  20 mL   BLOOD ADMINISTERED:none  DRAINS: none   LOCAL MEDICATIONS USED:  XYLOCAINE   SPECIMEN:  Source of Specimen:  left neck node  DISPOSITION OF SPECIMEN:  PATHOLOGY  COUNTS:  YES  TOURNIQUET:  * No tourniquets in log *  DICTATION: .Other Dictation: Dictation Number [id number]  PLAN OF CARE: Discharge to home after PACU  PATIENT DISPOSITION:  PACU - hemodynamically stable.   Delay start of Pharmacological VTE agent (>24hrs) due to surgical blood loss or risk of bleeding: yes

## 2020-01-29 NOTE — Anesthesia Procedure Notes (Signed)
Procedure Name: LMA Insertion Date/Time: 01/29/2020 7:43 AM Performed by: Willa Frater, CRNA Pre-anesthesia Checklist: Patient identified, Emergency Drugs available, Suction available and Patient being monitored Patient Re-evaluated:Patient Re-evaluated prior to induction Oxygen Delivery Method: Circle system utilized Preoxygenation: Pre-oxygenation with 100% oxygen Induction Type: IV induction Ventilation: Mask ventilation without difficulty LMA: LMA inserted LMA Size: 4.0 Number of attempts: 1 Airway Equipment and Method: Bite block Placement Confirmation: positive ETCO2 Tube secured with: Tape Dental Injury: Teeth and Oropharynx as per pre-operative assessment

## 2020-01-29 NOTE — Transfer of Care (Signed)
Immediate Anesthesia Transfer of Care Note  Patient: Damon Dudley  Procedure(s) Performed: EXCISIONAL BIOPSY OF LEFT NECK MASS (Left Neck)  Patient Location: PACU  Anesthesia Type:General  Level of Consciousness: sedated  Airway & Oxygen Therapy: Patient Spontanous Breathing and Patient connected to face mask oxygen  Post-op Assessment: Report given to RN and Post -op Vital signs reviewed and stable  Post vital signs: Reviewed and stable  Last Vitals:  Vitals Value Taken Time  BP 142/93 01/29/20 0901  Temp    Pulse 70 01/29/20 0901  Resp 12 01/29/20 0901  SpO2 96 % 01/29/20 0901  Vitals shown include unvalidated device data.  Last Pain:  Vitals:   01/29/20 0648  TempSrc: Oral  PainSc: 0-No pain         Complications: No apparent anesthesia complications

## 2020-01-29 NOTE — Op Note (Signed)
NAME: Damon Dudley, OGBORN MEDICAL RECORD [id number] ACCOUNT [id number] DATE OF BIRTH:May 20, 1961 FACILITY: [facility] LOCATION: [facility] PHYSICIAN:Jonus Coble Lincoln Maxin, MD  OPERATIVE REPORT  DATE OF PROCEDURE:  01/29/2020  PREOPERATIVE DIAGNOSIS:  Left neck lymphadenopathy with fine needle aspirate consistent with non-Hodgkin's lymphoma.  POSTOPERATIVE DIAGNOSIS:  Left neck lymphadenopathy with fine needle aspirate consistent with non-Hodgkin's lymphoma.  OPERATION PERFORMED:  Excisional biopsy of left neck node.  SURGEON:  [name], MD  ANESTHESIA:  General LMA.  COMPLICATIONS:  None.  ESTIMATED BLOOD LOSS:  10 mL.  BRIEF CLINICAL NOTE:  The patient is a 59 year old gentleman who has had a slowly enlarging left neck node for several months now.  He underwent a core needle biopsy that demonstrated findings consistent with non-Hodgkin's B cell lymphoma; however, the  specific type of lymphoma was difficult to determine and Dr. Irene Limbo recommended excisional biopsy to get a more definitive diagnosis.  The patient is taken to the operating room at this time for excisional biopsy of left neck node.  On review of the CT  scan patient has a conglomeration of nodes in the left lower neck.  DESCRIPTION OF PROCEDURE:  The patient was brought to the operating room and placed in supine position with a roll underneath the shoulders to extend his neck.  He underwent general LMA anesthesia.  The conglomeration of lymph nodes in the left lower  neck just behind the sternocleidomastoid was marked out and the proposed incision site was injected with 3-4 mL of Xylocaine with epinephrine.  Area was prepped with Betadine solution and draped in sterile towels.  Horizontal incision was made directly  over the mass of lymph nodes.  Dissection was carried down through the latissimus muscle down to the lymph nodes.  This was just posterior to external jugular vein.  The patient had a conglomeration of  lymph nodes in this area and the superficial,  probably 2 lymph nodes, were dissected out that measured approximately 3 cm in size, were removed and sent in saline fresh for lymphoma workup.  The patient had a deeper lymph node measuring approximately the same size 2-3 cm, but this was not removed.   Hemostasis was obtained with bipolar cautery.  After obtaining adequate hemostasis, the defect was closed with 3-0 chromic suture subcutaneously for subcutaneous tissue and then a 5-0 Vicryl subcuticular stitch followed by Dermabond.  The patient was  awoken from anesthesia and transferred to recovery room and postoperatively doing well.  DISPOSITION:  The patient is discharged home later this morning.  We will follow up in my office in 1 week for recheck.  He will follow up with Dr. Irene Limbo concerning final diagnosis and further treatment of his lymphoma.  CN/NUANCE  D:01/29/2020 T:01/29/2020 JOB:[id number]

## 2020-01-29 NOTE — Interval H&P Note (Signed)
History and Physical Interval Note:  01/29/2020 7:29 AM  Damon Dudley  has presented today for surgery, with the diagnosis of ENLARGED NECK MASS.  The various methods of treatment have been discussed with the patient and family. After consideration of risks, benefits and other options for treatment, the patient has consented to  Procedure(s): EXCISIONAL BIOPSY OF LEFT NECK MASS (Left) as a surgical intervention.  The patient's history has been reviewed, patient examined, no change in status, stable for surgery.  I have reviewed the patient's chart and labs.  Questions were answered to the patient's satisfaction.     [name]

## 2020-02-01 ENCOUNTER — Encounter: Payer: Self-pay | Admitting: *Deleted

## 2020-02-05 ENCOUNTER — Other Ambulatory Visit: Payer: Self-pay

## 2020-02-05 ENCOUNTER — Ambulatory Visit (INDEPENDENT_AMBULATORY_CARE_PROVIDER_SITE_OTHER): Payer: Medicare HMO | Admitting: Otolaryngology

## 2020-02-05 ENCOUNTER — Encounter (INDEPENDENT_AMBULATORY_CARE_PROVIDER_SITE_OTHER): Payer: Self-pay | Admitting: Otolaryngology

## 2020-02-05 VITALS — Temp 97.7°F

## 2020-02-05 DIAGNOSIS — Z4889 Encounter for other specified surgical aftercare: Secondary | ICD-10-CM
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
## 2020-02-06 LAB — SURGICAL PATHOLOGY

## 2020-02-09 ENCOUNTER — Inpatient Hospital Stay: Payer: Medicare HMO | Admitting: Hematology
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
## 2020-02-17 ENCOUNTER — Inpatient Hospital Stay: Payer: Medicare HMO | Attending: Hematology | Admitting: Hematology

## 2020-02-17 ENCOUNTER — Other Ambulatory Visit: Payer: Self-pay

## 2020-02-17 VITALS — BP 113/73 | HR 75 | Temp 97.3°F | Resp 17 | Ht 64.0 in | Wt 191.0 lb

## 2020-02-17 DIAGNOSIS — C8581 Other specified types of non-Hodgkin lymphoma, lymph nodes of head, face, and neck: Secondary | ICD-10-CM

## 2020-02-17 DIAGNOSIS — E119 Type 2 diabetes mellitus without complications: Secondary | ICD-10-CM | POA: Diagnosis not present

## 2020-02-17 DIAGNOSIS — C884 Extranodal marginal zone B-cell lymphoma of mucosa-associated lymphoid tissue [MALT-lymphoma]: Secondary | ICD-10-CM | POA: Insufficient documentation

## 2020-02-17 DIAGNOSIS — Z87891 Personal history of nicotine dependence: Secondary | ICD-10-CM | POA: Insufficient documentation

## 2020-02-17 DIAGNOSIS — I10 Essential (primary) hypertension: Secondary | ICD-10-CM | POA: Insufficient documentation

## 2020-02-17 DIAGNOSIS — Z79899 Other long term (current) drug therapy: Secondary | ICD-10-CM | POA: Diagnosis not present

## 2020-02-24 ENCOUNTER — Encounter (HOSPITAL_COMMUNITY): Payer: Self-pay | Admitting: Otolaryngology

## 2020-02-27 LAB — SURGICAL PATHOLOGY

## 2020-03-01 NOTE — Progress Notes (Signed)
Head and Neck Cancer Location of Tumor / Histology:  B-cell lymphoproliferative, EBV positive LEFT neck mass   Patient presented ~10 months ago with symptoms of: Slowly enlarging left neck node.   Biopsies revealed:  01/29/2020 FINAL MICROSCOPIC DIAGNOSIS:  A. SOFT TISSUE MASS, LEFT NECK, EXCISION:  - B-cell lymphoproliferative process, EBV positive  - See comment  COMMENT:  The excisional biopsy consists of an enlarged lymph node with effacement  of the normal nodal architecture. The lymphocytes are small to medium  in size with irregular nuclear contours.  12/08/2019 FINAL MICROSCOPIC DIAGNOSIS:  A. LYMPH NODE, LEFT NECK, NEEDLE CORE BIOPSY:  - Atypical lymphoid proliferation concerning for non-Hodgkin B-cell  lymphoma  - See comment  COMMENT:  The biopsy consists of multiple small fragmented cores of nodal tissue. The small and fragmented nature of the biopsy makes morphologic assessment difficult, but the lymphocytes overall appear small to medium in size. By immunohistochemistry, there is a predominance of CD20 positive B cells a subset of which appear to co-express CD5 and BCL-2. The lymphocytes are negative for CD10, BCL6, cyclin D1, CD30, Mum1  (subset) and have a low proliferative rate by Ki-67. EBV by in situ hybridization is positive. Flow cytometry identified a lambda restricted CD5-ositive B-cell population that comprised 17% of all  lymphocytes (See [id number]). The morphology in combination with a clonal B-cell population is  concerning for a low-grade B-cell lymphoma. However, the EBV positivity is unusual for a low-grade B-cell lymphoma. Therefore, an excisional biopsy is recommended to better classify this clonal process.  Nutrition Status Yes No Comments  Weight changes? '[]'  '[x]'    Swallowing concerns? '[]'  '[x]'    PEG? '[]'  '[x]'     Referrals Yes No Comments  Social Work? '[x]'  '[]'    Dentistry? '[]'  '[x]'    Swallowing therapy? '[x]'  '[]'    Nutrition? '[x]'  '[]'    Med/Onc?  '[x]'  '[]'     Safety Issues Yes No Comments  Prior radiation? '[]'  '[x]'    Pacemaker/ICD? '[]'  '[x]'    Possible current pregnancy? '[]'  '[x]'    Is the patient on methotrexate? '[]'  '[x]'     Tobacco/Marijuana/Snuff/ETOH use: Denies all  Past/Anticipated interventions by otolaryngology, if any: 01/29/2020 Dr. [name] Excisional biopsy of left neck node.  F/U with Dr. Lucia Gaskins on 02/05/2020: "He is doing well with no pain and no signs of infection . Complains of a little numbness around the region."  Past/Anticipated interventions by medical oncology, if any:  Under care of Dr. 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 -Will refer pt to Radiation Oncology -Will see back in 3 months with labs   Current Complaints / other details:  Nothing of note

## 2020-03-02 ENCOUNTER — Other Ambulatory Visit: Payer: Self-pay

## 2020-03-02 ENCOUNTER — Ambulatory Visit
Admission: RE | Admit: 2020-03-02 | Discharge: 2020-03-02 | Disposition: A | Discharge status: Home / Self Care | Payer: Medicare HMO | Source: Ambulatory Visit | Attending: Radiation Oncology | Admitting: Radiation Oncology

## 2020-03-02 ENCOUNTER — Encounter: Payer: Self-pay | Admitting: Radiation Oncology

## 2020-03-02 VITALS — BP 128/89 | HR 85 | Temp 98.2°F | Resp 16 | Ht 64.0 in | Wt 190.1 lb

## 2020-03-02 DIAGNOSIS — Z79899 Other long term (current) drug therapy: Secondary | ICD-10-CM | POA: Diagnosis not present

## 2020-03-02 DIAGNOSIS — C8581 Other specified types of non-Hodgkin lymphoma, lymph nodes of head, face, and neck: Secondary | ICD-10-CM | POA: Insufficient documentation

## 2020-03-02 DIAGNOSIS — Z87891 Personal history of nicotine dependence: Secondary | ICD-10-CM | POA: Diagnosis not present

## 2020-03-02 DIAGNOSIS — I10 Essential (primary) hypertension: Secondary | ICD-10-CM | POA: Diagnosis not present

## 2020-03-02 DIAGNOSIS — E119 Type 2 diabetes mellitus without complications: Secondary | ICD-10-CM | POA: Insufficient documentation

## 2020-03-02 DIAGNOSIS — Z794 Long term (current) use of insulin: Secondary | ICD-10-CM | POA: Diagnosis not present
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
## 2020-03-04 ENCOUNTER — Encounter: Payer: Self-pay | Admitting: Radiation Oncology

## 2020-03-04 DIAGNOSIS — C8581 Other specified types of non-Hodgkin lymphoma, lymph nodes of head, face, and neck: Secondary | ICD-10-CM | POA: Insufficient documentation

## 2020-03-07 ENCOUNTER — Ambulatory Visit
Admission: RE | Admit: 2020-03-07 | Discharge: 2020-03-07 | Disposition: A | Discharge status: Home / Self Care | Payer: Medicare HMO | Source: Ambulatory Visit | Attending: Radiation Oncology | Admitting: Radiation Oncology

## 2020-03-07 ENCOUNTER — Other Ambulatory Visit: Payer: Self-pay

## 2020-03-07 ENCOUNTER — Encounter (HOSPITAL_COMMUNITY): Payer: Self-pay

## 2020-03-07 DIAGNOSIS — C884 Extranodal marginal zone B-cell lymphoma of mucosa-associated lymphoid tissue [MALT-lymphoma]: Secondary | ICD-10-CM | POA: Diagnosis present

## 2020-03-08 DIAGNOSIS — C884 Extranodal marginal zone B-cell lymphoma of mucosa-associated lymphoid tissue [MALT-lymphoma]: Secondary | ICD-10-CM | POA: Diagnosis not present

## 2020-03-08 NOTE — Progress Notes (Signed)
Oncology Nurse Navigator Documentation  Placed introductory call to new referral patient Damon Dudley after several attempts to call patient and meet in person.   Introduced myself as the H&N oncology nurse navigator that works with Dr. Isidore Moos to whom he has been referred by Dr. Irene Limbo. He confirmed understanding of referral.  Briefly explained my role as his navigator, provided my contact information.   Confirmed understanding of upcoming appts and [place] location, explained arrival and registration process.    I encouraged him to call with questions/concerns as he moves forward with appts and procedures.    He verbalized understanding of information provided, expressed appreciation for my call.  Harlow Asa RN, BSN, OCN Head & Neck Oncology Nurse [place] at [facility] Phone # [phone]  Fax # [phone]

## 2020-03-14 ENCOUNTER — Other Ambulatory Visit: Payer: Self-pay

## 2020-03-14 ENCOUNTER — Encounter: Payer: Self-pay | Admitting: Emergency Medicine

## 2020-03-14 ENCOUNTER — Ambulatory Visit (INDEPENDENT_AMBULATORY_CARE_PROVIDER_SITE_OTHER): Payer: Medicare HMO | Admitting: Emergency Medicine

## 2020-03-14 VITALS — BP 118/77 | HR 78 | Temp 97.9°F | Ht 64.0 in | Wt 188.0 lb

## 2020-03-14 DIAGNOSIS — E1159 Type 2 diabetes mellitus with other circulatory complications: Secondary | ICD-10-CM

## 2020-03-14 DIAGNOSIS — C8591 Non-Hodgkin lymphoma, unspecified, lymph nodes of head, face, and neck: Secondary | ICD-10-CM

## 2020-03-14 DIAGNOSIS — I10 Essential (primary) hypertension: Secondary | ICD-10-CM | POA: Diagnosis not present

## 2020-03-14 DIAGNOSIS — I152 Hypertension secondary to endocrine disorders: Secondary | ICD-10-CM

## 2020-03-14 DIAGNOSIS — E1165 Type 2 diabetes mellitus with hyperglycemia: Secondary | ICD-10-CM | POA: Diagnosis not present

## 2020-03-14 LAB — POCT GLYCOSYLATED HEMOGLOBIN (HGB A1C): Hemoglobin A1C: 7.4 % — AB (ref 4.0–5.6)

## 2020-03-14 LAB — GLUCOSE, POCT (MANUAL RESULT ENTRY): POC Glucose: 55 mg/dl — AB (ref 70–99)

## 2020-03-14 MED ORDER — TRULICITY 0.75 MG/0.5ML ~~LOC~~ SOAJ: 0.7500 mg | SUBCUTANEOUS | 5 refills | Status: AC
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
## 2020-03-14 NOTE — Assessment & Plan Note (Signed)
Well-controlled hypertension.  Continue present medication.  No changes. Much improved diabetes with hemoglobin A1c at 7.4.  Continue present medications.  No changes.  Diet and nutrition discussed.  Follow-up in 3 months.  Continue statin therapy.

## 2020-03-14 NOTE — Patient Instructions (Addendum)
   If you have lab work done today you will be contacted with your lab results within the next 2 weeks.  If you have not heard from us then please contact us. The fastest way to get your results is to register for My Chart.   IF you received an x-ray today, you will receive an invoice from [facility]. Please contact [facility] at [phone] with questions or concerns regarding your invoice.   IF you received labwork today, you will receive an invoice from [facility]. Please contact [facility] at [phone] with questions or concerns regarding your invoice.   Our billing staff will not be able to assist you with questions regarding bills from these companies.  You will be contacted with the lab results as soon as they are available. The fastest way to get your results is to activate your My Chart account. Instructions are located on the last page of this paperwork. If you have not heard from us regarding the results in 2 weeks, please contact this office.     Diabetes mellitus y nutricin, en adultos Diabetes Mellitus and Nutrition, Adult Si sufre de diabetes (diabetes mellitus), es muy importante tener hbitos alimenticios saludables debido a que sus niveles de azcar en la sangre (glucosa) se ven afectados en gran medida por lo que come y bebe. Comer alimentos saludables en las cantidades adecuadas, aproximadamente a la misma hora todos los das, lo ayudar a:  Controlar la glucemia.  Disminuir el riesgo de sufrir una enfermedad cardaca.  Mejorar la presin arterial.  Alcanzar o mantener un peso saludable. Todas las personas que sufren de diabetes son diferentes y cada una tiene necesidades diferentes en cuanto a un plan de alimentacin. El mdico puede recomendarle que trabaje con un especialista en dietas y nutricin (nutricionista) para elaborar el mejor plan para usted. Su plan de alimentacin puede variar segn factores como:  Las caloras que  necesita.  Los medicamentos que toma.  Su peso.  Sus niveles de glucemia, presin arterial y colesterol.  Su nivel de actividad.  Otras afecciones que tenga, como enfermedades cardacas o renales. Cmo me afectan los carbohidratos? Los carbohidratos, o hidratos de carbono, afectan su nivel de glucemia ms que cualquier otro tipo de alimento. La ingesta de carbohidratos naturalmente aumenta la cantidad de glucosa en la sangre. El recuento de carbohidratos es un mtodo destinado a llevar un registro de la cantidad de carbohidratos que se consumen. El recuento de carbohidratos es importante para mantener la glucemia a un nivel saludable, especialmente si utiliza insulina o toma determinados medicamentos por va oral para la diabetes. Es importante conocer la cantidad de carbohidratos que se pueden ingerir en cada comida sin correr ningn riesgo. Esto es diferente en cada persona. Su nutricionista puede ayudarlo a calcular la cantidad de carbohidratos que debe ingerir en cada comida y en cada refrigerio. Entre los alimentos que contienen carbohidratos, se incluyen:  Pan, cereal, arroz, pastas y galletas.  Papas y maz.  Guisantes, frijoles y lentejas.  Leche y yogur.  Frutas y jugo.  Postres, como pasteles, galletas, helado y caramelos. Cmo me afecta el alcohol? El alcohol puede provocar disminuciones sbitas de la glucemia (hipoglucemia), especialmente si utiliza insulina o toma determinados medicamentos por va oral para la diabetes. La hipoglucemia es una afeccin potencialmente mortal. Los sntomas de la hipoglucemia (somnolencia, mareos y confusin) son similares a los sntomas de haber consumido demasiado alcohol. Si el mdico afirma que el alcohol es seguro para usted, siga estas pautas:    Limite el consumo de alcohol a no ms de 1medida por da si es mujer y no est embarazada, y a 2medidas si es hombre. Una medida equivale a 12oz (355ml) de cerveza, 5oz (148ml) de vino o  1oz (44ml) de bebidas alcohlicas de alta graduacin.  No beba con el estmago vaco.  Mantngase hidratado bebiendo agua, refrescos dietticos o t helado sin azcar.  Tenga en cuenta que los refrescos comunes, los jugos y otras bebida para mezclar pueden contener mucha azcar y se deben contar como carbohidratos. Cules son algunos consejos para seguir este plan?  Leer las etiquetas de los alimentos  Comience por leer el tamao de la porcin en la "Informacin nutricional" en las etiquetas de los alimentos envasados y las bebidas. La cantidad de caloras, carbohidratos, grasas y otros nutrientes mencionados en la etiqueta se basan en una porcin del alimento. Muchos alimentos contienen ms de una porcin por envase.  Verifique la cantidad total de gramos (g) de carbohidratos totales en una porcin. Puede calcular la cantidad de porciones de carbohidratos al dividir el total de carbohidratos por 15. Por ejemplo, si un alimento tiene un total de 30g de carbohidratos, equivale a 2 porciones de carbohidratos.  Verifique la cantidad de gramos (g) de grasas saturadas y grasas trans en una porcin. Escoja alimentos que no contengan grasa o que tengan un bajo contenido.  Verifique la cantidad de miligramos (mg) de sal (sodio) en una porcin. La mayora de las personas deben limitar la ingesta de sodio total a menos de 2300mg por da.  Siempre consulte la informacin nutricional de los alimentos etiquetados como "con bajo contenido de grasa" o "sin grasa". Estos alimentos pueden tener un mayor contenido de azcar agregada o carbohidratos refinados, y deben evitarse.  Hable con su nutricionista para identificar sus objetivos diarios en cuanto a los nutrientes mencionados en la etiqueta. Al ir de compras  Evite comprar alimentos procesados, enlatados o precocinados. Estos alimentos tienden a tener una mayor cantidad de grasa, sodio y azcar agregada.  Compre en la zona exterior de la tienda de  comestibles. Esta zona incluye frutas y verduras frescas, granos a granel, carnes frescas y productos lcteos frescos. Al cocinar  Utilice mtodos de coccin a baja temperatura, como hornear, en lugar de mtodos de coccin a alta temperatura, como frer en abundante aceite.  Cocine con aceites saludables, como el aceite de oliva, canola o girasol.  Evite cocinar con manteca, crema o carnes con alto contenido de grasa. Planificacin de las comidas  Coma las comidas y los refrigerios regularmente, preferentemente a la misma hora todos los das. Evite pasar largos perodos de tiempo sin comer.  Consuma alimentos ricos en fibra, como frutas frescas, verduras, frijoles y cereales integrales. Consulte a su nutricionista sobre cuntas porciones de carbohidratos puede consumir en cada comida.  Consuma entre 4 y 6 onzas (oz) de protenas magras por da, como carnes magras, pollo, pescado, huevos o tofu. Una onza de protena magra equivale a: ? 1 onza de carne, pollo o pescado. ? 1huevo. ?  taza de tofu.  Coma algunos alimentos por da que contengan grasas saludables, como aguacates, frutos secos, semillas y pescado. Estilo de vida  Controle su nivel de glucemia con regularidad.  Haga actividad fsica habitualmente como se lo haya indicado el mdico. Esto puede incluir lo siguiente: ? 150minutos semanales de ejercicio de intensidad moderada o alta. Esto podra incluir caminatas dinmicas, ciclismo o gimnasia acutica. ? Realizar ejercicios de elongacin y de fortalecimiento, como yoga o levantamiento   de pesas, por lo menos 2veces por semana.  Tome los medicamentos como se lo haya indicado el mdico.  No consuma ningn producto que contenga nicotina o tabaco, como cigarrillos y cigarrillos electrnicos. Si necesita ayuda para dejar de fumar, consulte al mdico.  Trabaje con un asesor o instructor en diabetes para identificar estrategias para controlar el estrs y cualquier desafo emocional  y social. Preguntas para hacerle al mdico  Es necesario que consulte a un instructor en el cuidado de la diabetes?  Es necesario que me rena con un nutricionista?  A qu nmero puedo llamar si tengo preguntas?  Cules son los mejores momentos para controlar la glucemia? Dnde encontrar ms informacin:  Asociacin Estadounidense de la Diabetes (American Diabetes Association): diabetes.org  Academia de Nutricin y Diettica (Academy of Nutrition and Dietetics): www.eatright.org  Instituto Nacional de la Diabetes y las Enfermedades Digestivas y Renales (National Institute of Diabetes and Digestive and Kidney Diseases, NIH): www.niddk.nih.gov Resumen  Un plan de alimentacin saludable lo ayudar a controlar la glucemia y mantener un estilo de vida saludable.  Trabajar con un especialista en dietas y nutricin (nutricionista) puede ayudarlo a elaborar el mejor plan de alimentacin para usted.  Tenga en cuenta que los carbohidratos (hidratos de carbono) y el alcohol tienen efectos inmediatos en sus niveles de glucemia. Es importante contar los carbohidratos que ingiere y consumir alcohol con prudencia. Esta informacin no tiene como fin reemplazar el consejo del mdico. Asegrese de hacerle al mdico cualquier pregunta que tenga. Document Revised: 04/23/2017 Document Reviewed: 12/03/2016 Elsevier Patient Education  2020 Elsevier Inc.  

## 2020-03-15 LAB — CMP14+EGFR
ALT: 23 IU/L (ref 0–44)
AST: 21 IU/L (ref 0–40)
Albumin/Globulin Ratio: 2.5 — ABNORMAL HIGH (ref 1.2–2.2)
Albumin: 4.5 g/dL (ref 3.8–4.8)
Alkaline Phosphatase: 78 IU/L (ref 48–121)
BUN/Creatinine Ratio: 19 (ref 10–24)
BUN: 13 mg/dL (ref 8–27)
Bilirubin Total: 0.6 mg/dL (ref 0.0–1.2)
CO2: 26 mmol/L (ref 20–29)
Calcium: 9.7 mg/dL (ref 8.6–10.2)
Chloride: 98 mmol/L (ref 96–106)
Creatinine, Ser: 0.67 mg/dL — ABNORMAL LOW (ref 0.76–1.27)
GFR calc Af Amer: 120 mL/min/{1.73_m2} (ref >59)
GFR calc non Af Amer: 104 mL/min/{1.73_m2} (ref >59)
Globulin, Total: 1.8 g/dL (ref 1.5–4.5)
Glucose: 97 mg/dL (ref 65–99)
Potassium: 3.7 mmol/L (ref 3.5–5.2)
Sodium: 138 mmol/L (ref 134–144)
Total Protein: 6.3 g/dL (ref 6.0–8.5)

## 2020-03-15 LAB — CBC WITH DIFFERENTIAL/PLATELET
Basophils Absolute: 0 10*3/uL (ref 0.0–0.2)
Basos: 1 %
EOS (ABSOLUTE): 0.1 10*3/uL (ref 0.0–0.4)
Eos: 2 %
Hematocrit: 44.1 % (ref 37.5–51.0)
Hemoglobin: 14.5 g/dL (ref 13.0–17.7)
Immature Grans (Abs): 0 10*3/uL (ref 0.0–0.1)
Immature Granulocytes: 0 %
Lymphocytes Absolute: 2 10*3/uL (ref 0.7–3.1)
Lymphs: 35 %
MCH: 28.9 pg (ref 26.6–33.0)
MCHC: 32.9 g/dL (ref 31.5–35.7)
MCV: 88 fL (ref 79–97)
Monocytes Absolute: 0.5 10*3/uL (ref 0.1–0.9)
Monocytes: 8 %
Neutrophils Absolute: 3.1 10*3/uL (ref 1.4–7.0)
Neutrophils: 54 %
Platelets: 275 10*3/uL (ref 150–450)
RBC: 5.02 x10E6/uL (ref 4.14–5.80)
RDW: 13.4 % (ref 11.6–15.4)
WBC: 5.8 10*3/uL (ref 3.4–10.8)

## 2020-03-28 ENCOUNTER — Other Ambulatory Visit: Payer: Self-pay

## 2020-03-28 ENCOUNTER — Ambulatory Visit
Admission: RE | Admit: 2020-03-28 | Discharge: 2020-03-28 | Disposition: A | Discharge status: Home / Self Care | Payer: Medicare HMO | Source: Ambulatory Visit | Attending: Radiation Oncology | Admitting: Radiation Oncology

## 2020-03-28 DIAGNOSIS — C8591 Non-Hodgkin lymphoma, unspecified, lymph nodes of head, face, and neck: Secondary | ICD-10-CM | POA: Insufficient documentation

## 2020-03-28 DIAGNOSIS — C884 Extranodal marginal zone B-cell lymphoma of mucosa-associated lymphoid tissue [MALT-lymphoma]: Secondary | ICD-10-CM | POA: Diagnosis present

## 2020-03-28 MED ORDER — SONAFINE EX EMUL
1.0000 "application " | Freq: Two times a day (BID) | CUTANEOUS | Status: DC
  Administered 2020-03-28: 1 via TOPICAL

## 2020-03-28 NOTE — Progress Notes (Signed)

## 2020-03-29 ENCOUNTER — Other Ambulatory Visit: Payer: Self-pay

## 2020-03-29 ENCOUNTER — Ambulatory Visit
Admission: RE | Admit: 2020-03-29 | Discharge: 2020-03-29 | Disposition: A | Discharge status: Home / Self Care | Payer: Medicare HMO | Source: Ambulatory Visit | Attending: Radiation Oncology | Admitting: Radiation Oncology

## 2020-03-29 DIAGNOSIS — C8591 Non-Hodgkin lymphoma, unspecified, lymph nodes of head, face, and neck: Secondary | ICD-10-CM | POA: Diagnosis not present

## 2020-03-30 ENCOUNTER — Ambulatory Visit
Admission: RE | Admit: 2020-03-30 | Discharge: 2020-03-30 | Disposition: A | Discharge status: Home / Self Care | Payer: Medicare HMO | Source: Ambulatory Visit | Attending: Radiation Oncology | Admitting: Radiation Oncology

## 2020-03-30 ENCOUNTER — Other Ambulatory Visit: Payer: Self-pay

## 2020-03-30 ENCOUNTER — Telehealth: Payer: Self-pay | Admitting: *Deleted

## 2020-03-30 DIAGNOSIS — C8591 Non-Hodgkin lymphoma, unspecified, lymph nodes of head, face, and neck: Secondary | ICD-10-CM | POA: Diagnosis not present

## 2020-03-30 NOTE — Telephone Encounter (Signed)
Spoke to [employer] to let Dr Pearlie Oyster nurse know Dr Mitchel Honour saw the message about the patient.

## 2020-03-31 ENCOUNTER — Ambulatory Visit
Admission: RE | Admit: 2020-03-31 | Discharge: 2020-03-31 | Disposition: A | Discharge status: Home / Self Care | Payer: Medicare HMO | Source: Ambulatory Visit | Attending: Radiation Oncology | Admitting: Radiation Oncology

## 2020-03-31 ENCOUNTER — Other Ambulatory Visit: Payer: Self-pay

## 2020-03-31 DIAGNOSIS — C8591 Non-Hodgkin lymphoma, unspecified, lymph nodes of head, face, and neck: Secondary | ICD-10-CM | POA: Diagnosis not present

## 2020-04-01 ENCOUNTER — Other Ambulatory Visit: Payer: Self-pay

## 2020-04-01 ENCOUNTER — Ambulatory Visit
Admission: RE | Admit: 2020-04-01 | Discharge: 2020-04-01 | Disposition: A | Discharge status: Home / Self Care | Payer: Medicare HMO | Source: Ambulatory Visit | Attending: Radiation Oncology | Admitting: Radiation Oncology

## 2020-04-01 DIAGNOSIS — C8591 Non-Hodgkin lymphoma, unspecified, lymph nodes of head, face, and neck: Secondary | ICD-10-CM | POA: Diagnosis not present

## 2020-04-04 ENCOUNTER — Other Ambulatory Visit: Payer: Self-pay

## 2020-04-04 ENCOUNTER — Ambulatory Visit
Admission: RE | Admit: 2020-04-04 | Discharge: 2020-04-04 | Disposition: A | Discharge status: Home / Self Care | Payer: Medicare HMO | Source: Ambulatory Visit | Attending: Radiation Oncology | Admitting: Radiation Oncology

## 2020-04-04 DIAGNOSIS — C8591 Non-Hodgkin lymphoma, unspecified, lymph nodes of head, face, and neck: Secondary | ICD-10-CM | POA: Diagnosis not present

## 2020-04-05 ENCOUNTER — Ambulatory Visit
Admission: RE | Admit: 2020-04-05 | Discharge: 2020-04-05 | Disposition: A | Discharge status: Home / Self Care | Payer: Medicare HMO | Source: Ambulatory Visit | Attending: Radiation Oncology | Admitting: Radiation Oncology

## 2020-04-05 ENCOUNTER — Other Ambulatory Visit: Payer: Self-pay

## 2020-04-05 DIAGNOSIS — C8591 Non-Hodgkin lymphoma, unspecified, lymph nodes of head, face, and neck: Secondary | ICD-10-CM | POA: Diagnosis not present

## 2020-04-06 ENCOUNTER — Ambulatory Visit
Admission: RE | Admit: 2020-04-06 | Discharge: 2020-04-06 | Disposition: A | Discharge status: Home / Self Care | Payer: Medicare HMO | Source: Ambulatory Visit | Attending: Radiation Oncology | Admitting: Radiation Oncology

## 2020-04-06 ENCOUNTER — Other Ambulatory Visit: Payer: Self-pay

## 2020-04-06 DIAGNOSIS — C8591 Non-Hodgkin lymphoma, unspecified, lymph nodes of head, face, and neck: Secondary | ICD-10-CM | POA: Diagnosis not present

## 2020-04-07 ENCOUNTER — Ambulatory Visit
Admission: RE | Admit: 2020-04-07 | Discharge: 2020-04-07 | Disposition: A | Discharge status: Home / Self Care | Payer: Medicare HMO | Source: Ambulatory Visit | Attending: Radiation Oncology | Admitting: Radiation Oncology

## 2020-04-07 ENCOUNTER — Other Ambulatory Visit: Payer: Self-pay

## 2020-04-07 DIAGNOSIS — C8591 Non-Hodgkin lymphoma, unspecified, lymph nodes of head, face, and neck: Secondary | ICD-10-CM | POA: Diagnosis not present

## 2020-04-08 ENCOUNTER — Other Ambulatory Visit: Payer: Self-pay

## 2020-04-08 ENCOUNTER — Ambulatory Visit
Admission: RE | Admit: 2020-04-08 | Discharge: 2020-04-08 | Disposition: A | Discharge status: Home / Self Care | Payer: Medicare HMO | Source: Ambulatory Visit | Attending: Radiation Oncology | Admitting: Radiation Oncology

## 2020-04-08 DIAGNOSIS — C8591 Non-Hodgkin lymphoma, unspecified, lymph nodes of head, face, and neck: Secondary | ICD-10-CM | POA: Diagnosis not present

## 2020-04-11 ENCOUNTER — Ambulatory Visit
Admission: RE | Admit: 2020-04-11 | Discharge: 2020-04-11 | Disposition: A | Discharge status: Home / Self Care | Payer: Medicare HMO | Source: Ambulatory Visit | Attending: Radiation Oncology | Admitting: Radiation Oncology

## 2020-04-11 ENCOUNTER — Other Ambulatory Visit: Payer: Self-pay

## 2020-04-11 DIAGNOSIS — C8591 Non-Hodgkin lymphoma, unspecified, lymph nodes of head, face, and neck: Secondary | ICD-10-CM | POA: Diagnosis not present

## 2020-04-11 MED ORDER — SONAFINE EX EMUL
1.0000 "application " | Freq: Once | CUTANEOUS | Status: AC
  Administered 2020-04-11: 1 via TOPICAL

## 2020-04-12 ENCOUNTER — Other Ambulatory Visit: Payer: Self-pay

## 2020-04-12 ENCOUNTER — Ambulatory Visit
Admission: RE | Admit: 2020-04-12 | Discharge: 2020-04-12 | Disposition: A | Discharge status: Home / Self Care | Payer: Medicare HMO | Source: Ambulatory Visit | Attending: Radiation Oncology | Admitting: Radiation Oncology

## 2020-04-12 DIAGNOSIS — C8591 Non-Hodgkin lymphoma, unspecified, lymph nodes of head, face, and neck: Secondary | ICD-10-CM | POA: Diagnosis not present

## 2020-04-13 ENCOUNTER — Ambulatory Visit
Admission: RE | Admit: 2020-04-13 | Discharge: 2020-04-13 | Disposition: A | Discharge status: Home / Self Care | Payer: Medicare HMO | Source: Ambulatory Visit | Attending: Radiation Oncology | Admitting: Radiation Oncology

## 2020-04-13 ENCOUNTER — Other Ambulatory Visit: Payer: Self-pay

## 2020-04-13 DIAGNOSIS — C8591 Non-Hodgkin lymphoma, unspecified, lymph nodes of head, face, and neck: Secondary | ICD-10-CM | POA: Diagnosis not present

## 2020-04-14 ENCOUNTER — Other Ambulatory Visit: Payer: Self-pay

## 2020-04-14 ENCOUNTER — Ambulatory Visit
Admission: RE | Admit: 2020-04-14 | Discharge: 2020-04-14 | Disposition: A | Discharge status: Home / Self Care | Payer: Medicare HMO | Source: Ambulatory Visit | Attending: Radiation Oncology | Admitting: Radiation Oncology

## 2020-04-14 DIAGNOSIS — C8591 Non-Hodgkin lymphoma, unspecified, lymph nodes of head, face, and neck: Secondary | ICD-10-CM | POA: Diagnosis not present

## 2020-04-15 ENCOUNTER — Ambulatory Visit
Admission: RE | Admit: 2020-04-15 | Discharge: 2020-04-15 | Disposition: A | Discharge status: Home / Self Care | Payer: Medicare HMO | Source: Ambulatory Visit | Attending: Radiation Oncology | Admitting: Radiation Oncology

## 2020-04-15 ENCOUNTER — Encounter: Payer: Self-pay | Admitting: Radiation Oncology

## 2020-04-15 ENCOUNTER — Other Ambulatory Visit: Payer: Self-pay

## 2020-04-15 DIAGNOSIS — C8591 Non-Hodgkin lymphoma, unspecified, lymph nodes of head, face, and neck: Secondary | ICD-10-CM | POA: Diagnosis not present

## 2020-04-27 ENCOUNTER — Telehealth: Payer: Self-pay

## 2020-04-27 NOTE — Telephone Encounter (Signed)
I called the patient today about their upcoming follow-up appointment in radiation oncology.   Given the state of the  COVID-19 pandemic, concerning case numbers in our community, and guidance from [facility], I offered a phone assessment with the patient to determine if coming to the clinic was necessary. The patient accepted.  I let the patient know that I had spoken with Dr. Isidore Moos, and she wanted them to know the importance of washing their hands for at least 20 seconds at a time, especially after going out in public, and before they eat. Limit going out in public whenever possible. Do not touch your face, unless your hands are clean, such as when bathing. Get plenty of rest, eat well, and stay hydrated. Patient verbalized understanding and agreement.   Symptomatically, the patient is doing relatively well. They report feeling less fatigued and being able to be more active. He reports redness on his neck is fading and skin is healing well. He reports a stable appetite and denies any difficulty swallowing to eat/drink. He reports full range of motion to neck, and just an occasional sharp pain (feels like a nerve waking up) that lasts less than a second. Overall he reports he fells well, and is pleased with his progress since completing radiation.  All questions were answered to the patient's satisfaction.  I encouraged the patient to call with any further questions. Otherwise, the plan is follow up with medical oncology (has an appointment with Dr. Sullivan Lone on 05/20/20), and reach out to radiation oncology as needed.    Patient is pleased with this plan, and we will cancel their upcoming follow-up to reduce the risk of COVID-19 transmission.

## 2020-04-29 ENCOUNTER — Ambulatory Visit: Payer: Self-pay | Admitting: Radiation Oncology

## 2020-05-19 ENCOUNTER — Ambulatory Visit: Payer: Medicare HMO | Admitting: Hematology

## 2020-05-19 ENCOUNTER — Other Ambulatory Visit: Payer: Medicare HMO

## 2020-05-20 ENCOUNTER — Inpatient Hospital Stay (HOSPITAL_BASED_OUTPATIENT_CLINIC_OR_DEPARTMENT_OTHER): Payer: Medicare HMO | Admitting: Hematology

## 2020-05-20 ENCOUNTER — Inpatient Hospital Stay: Payer: Medicare HMO | Attending: Hematology

## 2020-05-20 ENCOUNTER — Other Ambulatory Visit: Payer: Self-pay

## 2020-05-20 VITALS — BP 118/83 | HR 79 | Temp 98.6°F | Resp 18 | Ht 64.0 in | Wt 186.3 lb

## 2020-05-20 DIAGNOSIS — I10 Essential (primary) hypertension: Secondary | ICD-10-CM | POA: Diagnosis not present

## 2020-05-20 DIAGNOSIS — Z87891 Personal history of nicotine dependence: Secondary | ICD-10-CM | POA: Insufficient documentation

## 2020-05-20 DIAGNOSIS — C8591 Non-Hodgkin lymphoma, unspecified, lymph nodes of head, face, and neck: Secondary | ICD-10-CM | POA: Diagnosis present

## 2020-05-20 DIAGNOSIS — Z833 Family history of diabetes mellitus: Secondary | ICD-10-CM | POA: Insufficient documentation

## 2020-05-20 DIAGNOSIS — Z23 Encounter for immunization: Secondary | ICD-10-CM | POA: Diagnosis not present

## 2020-05-20 DIAGNOSIS — C8581 Other specified types of non-Hodgkin lymphoma, lymph nodes of head, face, and neck: Secondary | ICD-10-CM

## 2020-05-20 DIAGNOSIS — E119 Type 2 diabetes mellitus without complications: Secondary | ICD-10-CM | POA: Insufficient documentation

## 2020-05-20 DIAGNOSIS — R200 Anesthesia of skin: Secondary | ICD-10-CM | POA: Insufficient documentation

## 2020-05-20 LAB — LACTATE DEHYDROGENASE: LDH: 220 U/L — ABNORMAL HIGH (ref 98–192)

## 2020-05-20 LAB — CMP (CANCER CENTER ONLY)
ALT: 28 U/L (ref 0–44)
AST: 26 U/L (ref 15–41)
Albumin: 4.2 g/dL (ref 3.5–5.0)
Alkaline Phosphatase: 78 U/L (ref 38–126)
Anion gap: 10 (ref 5–15)
BUN: 10 mg/dL (ref 8–23)
CO2: 31 mmol/L (ref 22–32)
Calcium: 10.1 mg/dL (ref 8.9–10.3)
Chloride: 101 mmol/L (ref 98–111)
Creatinine: 0.69 mg/dL (ref 0.61–1.24)
GFR, Est AFR Am: >60 mL/min (ref >60)
GFR, Estimated: >60 mL/min (ref >60)
Glucose, Bld: 78 mg/dL (ref 70–99)
Potassium: 4.1 mmol/L (ref 3.5–5.1)
Sodium: 142 mmol/L (ref 135–145)
Total Bilirubin: 0.5 mg/dL (ref 0.3–1.2)
Total Protein: 6.7 g/dL (ref 6.5–8.1)

## 2020-05-20 LAB — CBC WITH DIFFERENTIAL/PLATELET
Abs Immature Granulocytes: 0.01 10*3/uL (ref 0.00–0.07)
Basophils Absolute: 0.1 10*3/uL (ref 0.0–0.1)
Basophils Relative: 1 %
Eosinophils Absolute: 0.1 10*3/uL (ref 0.0–0.5)
Eosinophils Relative: 2 %
HCT: 42.6 % (ref 39.0–52.0)
Hemoglobin: 14.3 g/dL (ref 13.0–17.0)
Immature Granulocytes: 0 %
Lymphocytes Relative: 30 %
Lymphs Abs: 1.9 10*3/uL (ref 0.7–4.0)
MCH: 28.9 pg (ref 26.0–34.0)
MCHC: 33.6 g/dL (ref 30.0–36.0)
MCV: 86.1 fL (ref 80.0–100.0)
Monocytes Absolute: 0.6 10*3/uL (ref 0.1–1.0)
Monocytes Relative: 9 %
Neutro Abs: 3.6 10*3/uL (ref 1.7–7.7)
Neutrophils Relative %: 58 %
Platelets: 274 10*3/uL (ref 150–400)
RBC: 4.95 MIL/uL (ref 4.22–5.81)
RDW: 13.2 % (ref 11.5–15.5)
WBC: 6.3 10*3/uL (ref 4.0–10.5)
nRBC: 0 % (ref 0.0–0.2)

## 2020-05-20 MED ORDER — INFLUENZA VAC SPLIT QUAD 0.5 ML IM SUSY
PREFILLED_SYRINGE | INTRAMUSCULAR | Status: AC
  Filled 2020-05-20: qty 0.5

## 2020-05-20 MED ORDER — INFLUENZA VAC SPLIT QUAD 0.5 ML IM SUSY
0.5000 mL | PREFILLED_SYRINGE | Freq: Once | INTRAMUSCULAR | Status: AC
  Administered 2020-05-20: 0.5 mL via INTRAMUSCULAR
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
## 2020-05-28 NOTE — Progress Notes (Signed)
  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   Plan: The patient will follow-up with radiation oncology in 73mo . -----------------------------------  Eppie Gibson, MD

## 2020-06-20 ENCOUNTER — Encounter: Payer: Self-pay | Admitting: Emergency Medicine

## 2020-06-20 ENCOUNTER — Other Ambulatory Visit: Payer: Self-pay

## 2020-06-20 ENCOUNTER — Ambulatory Visit (INDEPENDENT_AMBULATORY_CARE_PROVIDER_SITE_OTHER): Payer: Medicare HMO | Admitting: Emergency Medicine

## 2020-06-20 VITALS — BP 138/82 | HR 86 | Temp 97.9°F | Ht 64.0 in | Wt 186.2 lb

## 2020-06-20 DIAGNOSIS — N402 Nodular prostate without lower urinary tract symptoms: Secondary | ICD-10-CM

## 2020-06-20 DIAGNOSIS — Z23 Encounter for immunization: Secondary | ICD-10-CM

## 2020-06-20 DIAGNOSIS — I152 Hypertension secondary to endocrine disorders: Secondary | ICD-10-CM | POA: Diagnosis not present

## 2020-06-20 DIAGNOSIS — E1159 Type 2 diabetes mellitus with other circulatory complications: Secondary | ICD-10-CM | POA: Diagnosis not present

## 2020-06-20 DIAGNOSIS — Z6831 Body mass index (BMI) 31.0-31.9, adult: Secondary | ICD-10-CM

## 2020-06-20 DIAGNOSIS — I700 Atherosclerosis of aorta: Secondary | ICD-10-CM

## 2020-06-20 LAB — POCT GLYCOSYLATED HEMOGLOBIN (HGB A1C): Hemoglobin A1C: 6.9 % — AB (ref 4.0–5.6)

## 2020-06-20 LAB — GLUCOSE, POCT (MANUAL RESULT ENTRY): POC Glucose: 89 mg/dl (ref 70–99)

## 2020-06-20 NOTE — Assessment & Plan Note (Signed)
Well-controlled hypertension.  Continue present medication.  No changes. Well-controlled diabetes with hemoglobin A1c down to 6.9.  Continue present medications.  No changes. Diet and nutrition discussed Follow-up in 3 months

## 2020-06-20 NOTE — Patient Instructions (Addendum)
If you have lab work done today you will be contacted with your lab results within the next 2 weeks.  If you have not heard from [place] then please contact us. The fastest way to get your results is to register for My Chart.   IF you received an x-ray today, you will receive an invoice from [facility] Radiology. Please contact [facility] Radiology at [phone] with questions or concerns regarding your invoice.   IF you received labwork today, you will receive an invoice from [place] [facility]. Please contact [facility] at [phone] with questions or concerns regarding your invoice.   Our billing staff will not be able to assist you with questions regarding bills from these companies.  You will be contacted with the lab results as soon as they are available. The fastest way to get your results is to activate your My Chart account. Instructions are located on the last page of this paperwork. If you have not heard from [place] regarding the results in 2 weeks, please contact this office.      Mantenimiento de Teacher, English as a foreign language, Male Adoptar un estilo de vida saludable y recibir atencin preventiva son importantes para promover la salud y Musician. Consulte al mdico sobre:  El esquema adecuado para hacerse pruebas y exmenes peridicos.  Cosas que puede hacer por su cuenta para prevenir enfermedades y [place] sano. Qu debo saber sobre la dieta, el peso y el ejercicio? Consuma una dieta saludable   Consuma una dieta que incluya muchas verduras, frutas, productos lcteos con bajo contenido de [place] y Advertising account planner.  No consuma muchos alimentos ricos en grasas slidas, azcares agregados o sodio. Mantenga un peso saludable El ndice de masa muscular [facility]) es una medida que puede utilizarse para identificar posibles problemas de [place]. Proporciona una estimacin de la grasa corporal basndose en el peso y la altura. Su mdico puede ayudarle a Radiation protection practitioner [place] y a  Scientist, forensic o Theatre manager un peso saludable. Haga ejercicio con regularidad Haga ejercicio con regularidad. Esta es una de las prcticas ms importantes que puede hacer por su salud. La mayora de los adultos deben seguir estas pautas:  Optometrist, al menos, 169minutos de actividad fsica por semana. El ejercicio debe aumentar la frecuencia cardaca y Nature conservation officer transpirar (ejercicio de intensidad moderada).  Hacer ejercicios de fortalecimiento por lo [employer] por semana. Agregue esto a su plan de ejercicio de intensidad moderada.  Pasar menos tiempo sentados. Incluso la actividad fsica ligera puede ser beneficiosa. Controle sus niveles de colesterol y lpidos en la sangre Comience a realizarse anlisis de lpidos y Research officer, trade union en la sangre a los 20aos y luego reptalos cada 5aos. Es posible que Automotive engineer los niveles de colesterol con mayor frecuencia si:  Sus niveles de lpidos y colesterol son altos.  Es mayor de 40aos.  Presenta un alto riesgo de padecer enfermedades cardacas. Qu debo saber sobre las pruebas de deteccin del cncer? Muchos tipos de cncer pueden detectarse de manera temprana y, a menudo, pueden prevenirse. Segn su historia clnica y sus antecedentes familiares, es posible que deba realizarse pruebas de deteccin del cncer en diferentes edades. Esto puede incluir pruebas de deteccin de lo siguiente:  Surveyor, minerals.  Cncer de prstata.  Cncer de piel.  Cncer de pulmn. Qu debo saber sobre la enfermedad cardaca, la diabetes y la hipertensin arterial? Presin arterial y enfermedad cardaca  La hipertensin arterial causa enfermedades cardacas y [place] el riesgo de accidente cerebrovascular. Es ms probable  probable que esto se manifieste en las personas que tienen lecturas de presin arterial alta, tienen ascendencia africana o tienen sobrepeso.  Hable con el mdico sobre sus valores de presin arterial deseados.  Hgase controlar la presin  arterial: ? Cada 3 a 5 aos si tiene entre 18 y 39 aos. ? Todos los aos si es mayor de 40aos.  Si tiene entre 65 y 75 aos y es fumador o sola fumar, pregntele al mdico si debe realizarse una prueba de deteccin de aneurisma artico abdominal (AAA) por nica vez. Diabetes Realcese exmenes de deteccin de la diabetes con regularidad. Este anlisis revisa el nivel de azcar en la sangre en ayunas. Hgase las pruebas de deteccin:  Cada tresaos despus de los 45aos de edad si tiene un peso normal y un bajo riesgo de padecer diabetes.  Con ms frecuencia y a partir de una edad inferior si tiene sobrepeso o un alto riesgo de padecer diabetes. Qu debo saber sobre la prevencin de infecciones? Hepatitis B Si tiene un riesgo ms alto de contraer hepatitis B, debe someterse a un examen de deteccin de este virus. Hable con el mdico para averiguar si tiene riesgo de contraer la infeccin por hepatitis B. Hepatitis C Se recomienda un anlisis de sangre para:  Todos los que nacieron entre 1945 y 1965.  Todas las personas que tengan un riesgo de haber contrado hepatitis C. Enfermedades de transmisin sexual (ETS)  Debe realizarse pruebas de deteccin de ITS todos los aos, incluidas la gonorrea y la clamidia, si: ? Es sexualmente activo y es menor de 24aos. ? Es mayor de 24aos, y el mdico le informa que corre riesgo de tener este tipo de infecciones. ? La actividad sexual ha cambiado desde que le hicieron la ltima prueba de deteccin y tiene un riesgo mayor de tener clamidia o gonorrea. Pregntele al mdico si usted tiene riesgo.  Pregntele al mdico si usted tiene un alto riesgo de contraer VIH. El mdico tambin puede recomendarle un medicamento recetado para ayudar a evitar la infeccin por el VIH. Si elige tomar medicamentos para prevenir el VIH, primero debe hacerse los anlisis de deteccin del VIH. Luego debe hacerse anlisis cada 3meses mientras est tomando los  medicamentos. Siga estas instrucciones en su casa: Estilo de vida  No consuma ningn producto que contenga nicotina o tabaco, como cigarrillos, cigarrillos electrnicos y tabaco de mascar. Si necesita ayuda para dejar de fumar, consulte al mdico.  No consuma drogas.  No comparta agujas.  Solicite ayuda a su mdico si necesita apoyo o informacin para abandonar las drogas. Consumo de alcohol  No beba alcohol si el mdico se lo prohbe.  Si bebe alcohol: ? Limite la cantidad que consume de 0 a 2 medidas por da. ? Est atento a la cantidad de alcohol que hay en las bebidas que toma. En los [place], una medida equivale a una botella de cerveza de 12oz (355ml), un vaso de vino de 5oz (148ml) o un vaso de una bebida alcohlica de alta graduacin de 1oz (44ml). Instrucciones generales  Realcese los estudios de rutina de la salud, dentales y de la vista.  Mantngase al da con las vacunas.  Infrmele a su mdico si: ? Se siente deprimido con frecuencia. ? Alguna vez ha sido vctima de maltrato o no se siente seguro en su casa. Resumen  Adoptar un estilo de vida saludable y recibir atencin preventiva son importantes para promover la salud y el bienestar.  Siga las instrucciones del mdico   de una dieta saludable, el ejercicio y la realizacin de pruebas o exmenes para Engineer, building services.  Siga las instrucciones del mdico con respecto al control del colesterol y la presin arterial. Esta informacin no tiene Marine scientist el consejo del mdico. Asegrese de hacerle al mdico cualquier pregunta que tenga. Document Revised: 09/03/2018 Document Reviewed: 09/03/2018 Elsevier Patient Education  [place].
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
## 2020-06-21 LAB — PSA: Prostate Specific Ag, Serum: 2.4 ng/mL (ref 0.0–4.0)

## 2020-06-27 ENCOUNTER — Other Ambulatory Visit: Payer: Self-pay | Admitting: Emergency Medicine

## 2020-06-27 DIAGNOSIS — E1165 Type 2 diabetes mellitus with hyperglycemia: Secondary | ICD-10-CM

## 2020-07-13 ENCOUNTER — Other Ambulatory Visit: Payer: Self-pay | Admitting: Emergency Medicine

## 2020-07-13 DIAGNOSIS — E1165 Type 2 diabetes mellitus with hyperglycemia: Secondary | ICD-10-CM

## 2020-08-04 ENCOUNTER — Other Ambulatory Visit: Payer: Self-pay | Admitting: Emergency Medicine

## 2020-08-04 DIAGNOSIS — I10 Essential (primary) hypertension: Secondary | ICD-10-CM

## 2020-09-21 ENCOUNTER — Ambulatory Visit (INDEPENDENT_AMBULATORY_CARE_PROVIDER_SITE_OTHER): Payer: Medicare HMO | Admitting: Emergency Medicine

## 2020-09-21 ENCOUNTER — Encounter: Payer: Self-pay | Admitting: Emergency Medicine

## 2020-09-21 ENCOUNTER — Other Ambulatory Visit: Payer: Self-pay

## 2020-09-21 VITALS — BP 131/81 | HR 74 | Temp 97.2°F | Resp 16 | Ht 64.0 in | Wt 193.0 lb

## 2020-09-21 DIAGNOSIS — E1165 Type 2 diabetes mellitus with hyperglycemia: Secondary | ICD-10-CM

## 2020-09-21 DIAGNOSIS — I10 Essential (primary) hypertension: Secondary | ICD-10-CM

## 2020-09-21 DIAGNOSIS — I700 Atherosclerosis of aorta: Secondary | ICD-10-CM | POA: Diagnosis not present

## 2020-09-21 DIAGNOSIS — I152 Hypertension secondary to endocrine disorders: Secondary | ICD-10-CM

## 2020-09-21 DIAGNOSIS — N402 Nodular prostate without lower urinary tract symptoms: Secondary | ICD-10-CM

## 2020-09-21 DIAGNOSIS — E1159 Type 2 diabetes mellitus with other circulatory complications: Secondary | ICD-10-CM | POA: Diagnosis not present

## 2020-09-21 DIAGNOSIS — C8591 Non-Hodgkin lymphoma, unspecified, lymph nodes of head, face, and neck: Secondary | ICD-10-CM

## 2020-09-21 LAB — POCT GLYCOSYLATED HEMOGLOBIN (HGB A1C): Hemoglobin A1C: 7.1 % — AB (ref 4.0–5.6)

## 2020-09-21 LAB — GLUCOSE, POCT (MANUAL RESULT ENTRY): POC Glucose: 108 mg/dl — AB (ref 70–99)

## 2020-09-21 MED ORDER — ENALAPRIL-HYDROCHLOROTHIAZIDE 10-25 MG PO TABS: 1.0000 | ORAL_TABLET | Freq: Every day | ORAL | 3 refills | Status: DC

## 2020-09-21 MED ORDER — METFORMIN HCL 1000 MG PO TABS: 1000.0000 mg | ORAL_TABLET | Freq: Two times a day (BID) | ORAL | 3 refills | Status: DC

## 2020-09-21 MED ORDER — GLIPIZIDE 10 MG PO TABS: 10.0000 mg | ORAL_TABLET | Freq: Every day | ORAL | 3 refills | Status: DC

## 2020-09-21 MED ORDER — TRULICITY 1.5 MG/0.5ML ~~LOC~~ SOAJ: 1.5000 mg | SUBCUTANEOUS | 3 refills | Status: DC

## 2020-09-21 MED ORDER — TRESIBA FLEXTOUCH 100 UNIT/ML ~~LOC~~ SOPN
25.0000 [IU] | PEN_INJECTOR | Freq: Every day | SUBCUTANEOUS | 5 refills | Status: DC
Start: 2020-09-21 — End: 2021-09-18

## 2020-09-21 MED ORDER — ATORVASTATIN CALCIUM 40 MG PO TABS: 40.0000 mg | ORAL_TABLET | Freq: Every day | ORAL | 3 refills | Status: DC
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
## 2020-09-21 NOTE — Patient Instructions (Addendum)
The referral has been sent to [facility]. Please call phone number [phone].   If you have lab work done today you will be contacted with your lab results within the next 2 weeks.  If you have not heard from [place] then please contact us. The fastest way to get your results is to register for My Chart.   IF you received an x-ray today, you will receive an invoice from [facility]. Please contact [facility] at [phone] with questions or concerns regarding your invoice.   IF you received labwork today, you will receive an invoice from [facility]. Please contact [facility] at [phone] with questions or concerns regarding your invoice.   Our billing staff will not be able to assist you with questions regarding bills from these companies.  You will be contacted with the lab results as soon as they are available. The fastest way to get your results is to activate your My Chart account. Instructions are located on the last page of this paperwork. If you have not heard from [place] regarding the results in 2 weeks, please contact this office.     Diabetes mellitus y nutricin, en adultos Diabetes Mellitus and Nutrition, Adult Si sufre de diabetes, o diabetes mellitus, es muy importante tener hbitos alimenticios saludables debido a que sus niveles de Designer, television/film set sangre (glucosa) se ven afectados en gran medida por lo que come y bebe. [place] alimentos saludables en las cantidades correctas, aproximadamente a la misma hora todos los [place], [place] ayudar a:  Aeronautical engineer glucemia.  Disminuir el riesgo de sufrir una enfermedad cardaca.  Mejorar la presin arterial.  Science writer o mantener un peso saludable. Qu puede afectar mi plan de alimentacin? Todas las personas que sufren de diabetes son diferentes y cada una tiene necesidades diferentes en cuanto a un plan de alimentacin. El mdico puede recomendarle que trabaje con un nutricionista para elaborar el mejor plan  para usted. Su plan de alimentacin puede variar segn factores como:  Las caloras que necesita.  Los medicamentos que toma.  Su peso.  Sus niveles de glucemia, presin arterial y colesterol.  Su nivel de [place].  Otras afecciones que tenga, como enfermedades cardacas o renales. Cmo me afectan los carbohidratos? Los carbohidratos, o hidratos de carbono, afectan su nivel de glucemia ms que cualquier otro tipo de alimento. La ingesta de carbohidratos naturalmente aumenta la cantidad de [employer]. El recuento de carbohidratos es un mtodo destinado a Catering manager un registro de la cantidad de carbohidratos que se consumen. El recuento de carbohidratos es importante para Theatre manager la glucemia a un nivel saludable, especialmente si utiliza insulina o toma determinados medicamentos por va oral para la diabetes. Es importante conocer la cantidad de carbohidratos que se pueden ingerir en cada comida sin correr Engineer, manufacturing. Esto es Psychologist, forensic. Su nutricionista puede ayudarlo a calcular la cantidad de carbohidratos que debe ingerir en cada comida y en cada refrigerio. Cmo me afecta el alcohol? El alcohol puede provocar disminuciones sbitas de la glucemia (hipoglucemia), especialmente si utiliza insulina o toma determinados medicamentos por va oral para la diabetes. La hipoglucemia es una afeccin potencialmente mortal. Los sntomas de la hipoglucemia, como somnolencia, mareos y confusin, son similares a los sntomas de haber consumido demasiado alcohol.  No beba alcohol si: ? Su mdico le indica no hacerlo. ? Est embarazada, puede estar embarazada o est tratando de quedar embarazada.  Si bebe alcohol: ? No beba con el estmago vaco. ? Limite la  cantidad que bebe:  De 0 a 1 medida por da para las mujeres.  De 0 a 2 medidas por da para los hombres. ? Est atento a la cantidad de alcohol que hay en las bebidas que toma. En los [place], una medida  equivale a una botella de cerveza de 12oz (342ml), un vaso de vino de 5oz (136ml) o un vaso de una bebida alcohlica de alta graduacin de 1oz (64ml). ? Mantngase hidratado bebiendo agua, refrescos dietticos o t helado sin azcar.  Tenga en cuenta que los refrescos comunes, los jugos y otras bebida para Optician, dispensing pueden contener mucha azcar y se deben contar como carbohidratos. Consejos para seguir Photographer las etiquetas de los alimentos  Comience por leer el tamao de la porcin en la "Informacin nutricional" en las etiquetas de los alimentos envasados y las bebidas. La cantidad de caloras, carbohidratos, grasas y otros nutrientes mencionados en la etiqueta se basan en una porcin del alimento. Muchos alimentos contienen ms de una porcin por envase.  Verifique la cantidad total de gramos (g) de carbohidratos totales en una porcin. Puede calcular la cantidad de porciones de carbohidratos al dividir el total de carbohidratos por 15. Por ejemplo, si un alimento tiene un total de 30g de carbohidratos totales por porcin, equivale a 2 porciones de carbohidratos.  Verifique la cantidad de gramos (g) de grasas saturadas y grasas trans de una porcin. Escoja alimentos que no contengan estas grasas o que su contenido de estas sea [place].  Verifique la cantidad de miligramos (mg) de sal (sodio) en una porcin. La mayora de las personas deben limitar la ingesta de sodio total a menos de 2300mg  por Training and development officer.  Siempre consulte la informacin nutricional de los alimentos etiquetados como "con bajo contenido de grasa" o "sin grasa". Estos alimentos pueden tener un mayor contenido de Location manager agregada o carbohidratos refinados, y deben evitarse.  Hable con su nutricionista para identificar sus objetivos diarios en cuanto a los nutrientes mencionados en la etiqueta. Al ir de compras  Evite comprar alimentos procesados, enlatados o precocidos. Estos alimentos tienden a Special educational needs teacher mayor cantidad de  [place], sodio y azcar agregada.  Compre en la zona exterior de la tienda de comestibles. Esta es la zona donde se encuentran con mayor frecuencia las frutas y las verduras frescas, los cereales a granel, las carnes frescas y los productos lcteos frescos. Al cocinar  Utilice mtodos de coccin a baja temperatura, como hornear, en lugar de mtodos de coccin a alta temperatura, como frer en abundante aceite.  Cocine con aceites saludables, como el aceite de [place], canola o [place].  Evite cocinar con manteca, crema o carnes con alto contenido de grasa. Planificacin de las comidas  Coma las comidas y los refrigerios regularmente, preferentemente a la misma hora todos [place]. Evite pasar largos perodos de tiempo sin comer.  Consuma alimentos ricos en fibra, como frutas frescas, verduras, frijoles y cereales integrales. Consulte a su nutricionista sobre cuntas porciones de carbohidratos puede consumir en cada comida.  Consuma entre 4 y 6 onzas (entre 112 y 168g) de protenas magras por da, como carnes magras, pollo, pescado, huevos o tofu. Una onza (oz) de protena magra equivale a: ? 1 onza (28g) de carne, pollo o pescado. ? 1huevo. ?  de taza (62 g) de tofu.  Coma algunos alimentos por da que contengan grasas saludables, como aguacates, frutos secos, semillas y pescado.   Qu alimentos debo comer? Lambert Mody Bayas. Manzanas. Naranjas. Duraznos. Damascos. Ciruelas. Uvas. Mango.  Papaya. Harmon. Kiwi. Cerezas. [place] Valeda Malm. Espinaca. Verduras de [employer], que incluyen col rizada, [place], hojas de [place] y de [place]. Remolachas. Coliflor. Repollo. Brcoli. Zanahorias. Judas verdes. Tomates. Pimientos. Cebollas. Pepinos. Coles de Bruselas. Granos Granos integrales, como panes, galletas, tortillas, cereales y pastas de salvado o integrales. Avena sin azcar. Quinua. Arroz integral o salvaje. Carnes y Psychiatric nurse. Carne de ave sin piel. Cortes magros de ave y carne  de res. Tofu. Frutos secos. Semillas. Lcteos Productos lcteos sin grasa o con bajo contenido de [place], [place], yogur y [place]. Es posible que los productos que se enumeran ms [place] no constituyan una lista completa de los alimentos y las bebidas que puede tomar. Consulte a un nutricionista para obtener ms informacin. Qu alimentos debo evitar? Lambert Mody Frutas enlatadas al almbar. Verduras Verduras enlatadas. Verduras congeladas con mantequilla o salsa de crema. Granos Productos elaborados con [place] y [place], como panes, pastas, bocadillos y cereales. Evite todos los alimentos procesados. Carnes y otras protenas Cortes de carne con alto contenido de Lobbyist. Carne de ave con piel. Carnes empanizadas o fritas. Carne procesada. Evite las grasas saturadas. Lcteos Yogur, [place] enteros. Bebidas Bebidas azucaradas, como gaseosas o t helado. Es posible que los productos que se enumeran ms [place] no constituyan una lista completa de los alimentos y las bebidas que Nurse, adult. Consulte a un nutricionista para obtener ms informacin. Preguntas para hacerle al mdico  Es necesario que me rena con Radio broadcast assistant en el cuidado de la diabetes?  Es necesario que me rena con un nutricionista?  A qu nmero puedo llamar si tengo preguntas?  Cules son los mejores momentos para controlar la glucemia? Dnde encontrar ms informacin:  Asociacin Estadounidense de la Diabetes (American Diabetes Association): diabetes.org  Academy of Nutrition and Dietetics (Academia de Nutricin y Information systems manager): www.eatright.[employer]X Corporation of Diabetes and Digestive and Kidney Diseases (Sonoita la Diabetes y [place] y [place]): DesMoinesFuneral.dk  Association of Diabetes Care and Education Specialists (Asociacin de Especialistas en Atencin y Educacin sobre la Diabetes): www.diabeteseducator.org Resumen  Es importante tener hbitos  alimenticios saludables debido a que sus niveles de Designer, television/film set sangre (glucosa) se ven afectados en gran medida por lo que come y bebe.  Un plan de alimentacin saludable lo ayudar a controlar la glucemia y Theatre manager un estilo de vida saludable.  El mdico puede recomendarle que trabaje con un nutricionista para elaborar el mejor plan para usted.  Tenga en cuenta que los carbohidratos (hidratos de carbono) y el alcohol tienen efectos inmediatos en sus niveles de glucemia. Es importante contar los carbohidratos que ingiere y consumir alcohol con prudencia. Esta informacin no tiene Marine scientist el consejo del mdico. Asegrese de hacerle al mdico cualquier pregunta que tenga. Document Revised: 09/17/2019 Document Reviewed: 09/17/2019 Elsevier Patient Education  2021 [employer].

## 2020-09-21 NOTE — Assessment & Plan Note (Signed)
Normal PSA.  No urology evaluation yet.  Given information for [facility].

## 2020-09-21 NOTE — Assessment & Plan Note (Signed)
Well-controlled hypertension.  Continue present medications.  No changes. Hemoglobin A1c at 7.1 slightly higher than before.  Will increase Trulicity to 1.5 mg weekly.  Continue other medications.  Diet and nutrition discussed. Continue atorvastatin. Follow-up in 3 months.

## 2020-10-05 ENCOUNTER — Telehealth: Payer: Self-pay | Admitting: *Deleted

## 2020-10-05 NOTE — Telephone Encounter (Signed)
On 10/04/2020, faxed Rx request for Metformin to [facility]. Confirmation page 11:56 am.
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
## 2020-11-17 ENCOUNTER — Inpatient Hospital Stay: Payer: Medicare HMO | Admitting: Hematology

## 2020-11-17 ENCOUNTER — Other Ambulatory Visit: Payer: Self-pay

## 2020-11-17 ENCOUNTER — Inpatient Hospital Stay: Payer: Medicare HMO

## 2020-11-17 ENCOUNTER — Inpatient Hospital Stay: Payer: Medicare HMO | Attending: Hematology

## 2020-11-17 ENCOUNTER — Other Ambulatory Visit: Payer: Self-pay | Admitting: *Deleted

## 2020-11-17 VITALS — BP 124/78 | HR 74 | Temp 97.3°F | Resp 17 | Ht 64.0 in | Wt 192.3 lb

## 2020-11-17 DIAGNOSIS — E559 Vitamin D deficiency, unspecified: Secondary | ICD-10-CM | POA: Diagnosis not present

## 2020-11-17 DIAGNOSIS — C8581 Other specified types of non-Hodgkin lymphoma, lymph nodes of head, face, and neck: Secondary | ICD-10-CM

## 2020-11-17 DIAGNOSIS — R599 Enlarged lymph nodes, unspecified: Secondary | ICD-10-CM | POA: Insufficient documentation

## 2020-11-17 DIAGNOSIS — D696 Thrombocytopenia, unspecified: Secondary | ICD-10-CM

## 2020-11-17 DIAGNOSIS — Z23 Encounter for immunization: Secondary | ICD-10-CM

## 2020-11-17 LAB — CBC WITH DIFFERENTIAL/PLATELET
Abs Immature Granulocytes: 0.01 10*3/uL (ref 0.00–0.07)
Basophils Absolute: 0 10*3/uL (ref 0.0–0.1)
Basophils Relative: 1 %
Eosinophils Absolute: 0.1 10*3/uL (ref 0.0–0.5)
Eosinophils Relative: 3 %
HCT: 39.1 % (ref 39.0–52.0)
Hemoglobin: 13.4 g/dL (ref 13.0–17.0)
Immature Granulocytes: 0 %
Lymphocytes Relative: 28 %
Lymphs Abs: 1.3 10*3/uL (ref 0.7–4.0)
MCH: 29.3 pg (ref 26.0–34.0)
MCHC: 34.3 g/dL (ref 30.0–36.0)
MCV: 85.4 fL (ref 80.0–100.0)
Monocytes Absolute: 0.4 10*3/uL (ref 0.1–1.0)
Monocytes Relative: 8 %
Neutro Abs: 2.9 10*3/uL (ref 1.7–7.7)
Neutrophils Relative %: 60 %
Platelets: 46 10*3/uL — ABNORMAL LOW (ref 150–400)
RBC: 4.58 MIL/uL (ref 4.22–5.81)
RDW: 13 % (ref 11.5–15.5)
WBC: 4.7 10*3/uL (ref 4.0–10.5)
nRBC: 0 % (ref 0.0–0.2)

## 2020-11-17 LAB — CMP (CANCER CENTER ONLY)
ALT: 19 U/L (ref 0–44)
AST: 19 U/L (ref 15–41)
Albumin: 4.1 g/dL (ref 3.5–5.0)
Alkaline Phosphatase: 77 U/L (ref 38–126)
Anion gap: 10 (ref 5–15)
BUN: 11 mg/dL (ref 8–23)
CO2: 30 mmol/L (ref 22–32)
Calcium: 9.1 mg/dL (ref 8.9–10.3)
Chloride: 102 mmol/L (ref 98–111)
Creatinine: 0.7 mg/dL (ref 0.61–1.24)
GFR, Estimated: >60 mL/min (ref >60)
Glucose, Bld: 137 mg/dL — ABNORMAL HIGH (ref 70–99)
Potassium: 4.3 mmol/L (ref 3.5–5.1)
Sodium: 142 mmol/L (ref 135–145)
Total Bilirubin: 0.4 mg/dL (ref 0.3–1.2)
Total Protein: 6.3 g/dL — ABNORMAL LOW (ref 6.5–8.1)

## 2020-11-17 LAB — CBC WITH DIFFERENTIAL (CANCER CENTER ONLY)
Abs Immature Granulocytes: 0.01 10*3/uL (ref 0.00–0.07)
Basophils Absolute: 0 10*3/uL (ref 0.0–0.1)
Basophils Relative: 1 %
Eosinophils Absolute: 0.1 10*3/uL (ref 0.0–0.5)
Eosinophils Relative: 3 %
HCT: 41.2 % (ref 39.0–52.0)
Hemoglobin: 13.6 g/dL (ref 13.0–17.0)
Immature Granulocytes: 0 %
Lymphocytes Relative: 31 %
Lymphs Abs: 1.5 10*3/uL (ref 0.7–4.0)
MCH: 28.5 pg (ref 26.0–34.0)
MCHC: 33 g/dL (ref 30.0–36.0)
MCV: 86.4 fL (ref 80.0–100.0)
Monocytes Absolute: 0.5 10*3/uL (ref 0.1–1.0)
Monocytes Relative: 9 %
Neutro Abs: 2.8 10*3/uL (ref 1.7–7.7)
Neutrophils Relative %: 56 %
Platelet Count: 46 10*3/uL — ABNORMAL LOW (ref 150–400)
RBC: 4.77 MIL/uL (ref 4.22–5.81)
RDW: 13 % (ref 11.5–15.5)
WBC Count: 5 10*3/uL (ref 4.0–10.5)
nRBC: 0 % (ref 0.0–0.2)

## 2020-11-17 LAB — IMMATURE PLATELET FRACTION: Immature Platelet Fraction: 7.2 % (ref 1.2–8.6)

## 2020-11-17 LAB — LACTATE DEHYDROGENASE: LDH: 161 U/L (ref 98–192)

## 2020-11-17 LAB — PLATELET BY CITRATE

## 2020-11-17 LAB — VITAMIN D 25 HYDROXY (VIT D DEFICIENCY, FRACTURES): Vit D, 25-Hydroxy: 28.72 ng/mL — ABNORMAL LOW (ref 30–100)

## 2020-11-24 ENCOUNTER — Telehealth: Payer: Self-pay

## 2020-11-24 ENCOUNTER — Other Ambulatory Visit: Payer: Self-pay

## 2020-11-24 ENCOUNTER — Telehealth: Payer: Self-pay | Admitting: Hematology

## 2020-11-24 DIAGNOSIS — D696 Thrombocytopenia, unspecified: Secondary | ICD-10-CM

## 2020-11-24 NOTE — Telephone Encounter (Signed)
Scheduled appt per 3/31 sch msg. Pt aware.  

## 2020-11-24 NOTE — Telephone Encounter (Signed)
Contacted patient to let him know labs still showed thrombocytopenia.  Also let pt know he has some vit D deficiency and Dr Irene Limbo recommend starting vit d 2000 units daily OTC. Pt acknowledged Told pt he should call us if he has an abnormal bleeding or bruising issues.

## 2020-12-20 ENCOUNTER — Ambulatory Visit (INDEPENDENT_AMBULATORY_CARE_PROVIDER_SITE_OTHER): Payer: Medicare HMO | Admitting: Emergency Medicine

## 2020-12-20 ENCOUNTER — Ambulatory Visit: Payer: Self-pay | Admitting: Emergency Medicine

## 2020-12-20 ENCOUNTER — Encounter: Payer: Self-pay | Admitting: Emergency Medicine

## 2020-12-20 ENCOUNTER — Other Ambulatory Visit: Payer: Self-pay

## 2020-12-20 VITALS — BP 120/80 | HR 78 | Temp 98.3°F | Ht 64.0 in | Wt 188.4 lb

## 2020-12-20 DIAGNOSIS — I152 Hypertension secondary to endocrine disorders: Secondary | ICD-10-CM | POA: Diagnosis not present

## 2020-12-20 DIAGNOSIS — N402 Nodular prostate without lower urinary tract symptoms: Secondary | ICD-10-CM

## 2020-12-20 DIAGNOSIS — Z1211 Encounter for screening for malignant neoplasm of colon: Secondary | ICD-10-CM | POA: Diagnosis not present

## 2020-12-20 DIAGNOSIS — E1159 Type 2 diabetes mellitus with other circulatory complications: Secondary | ICD-10-CM | POA: Diagnosis not present

## 2020-12-20 DIAGNOSIS — C8591 Non-Hodgkin lymphoma, unspecified, lymph nodes of head, face, and neck: Secondary | ICD-10-CM | POA: Diagnosis not present

## 2020-12-20 LAB — POCT GLYCOSYLATED HEMOGLOBIN (HGB A1C): Hemoglobin A1C: 6.3 % — AB (ref 4.0–5.6)
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
## 2020-12-20 NOTE — Assessment & Plan Note (Signed)
Discussed.  No concerns during recent urological evaluation. Tolerating medication well, possibly Flomax but does not remember name. Taking medication every other day and lower urinary tract symptoms much improved. Did have some problems reaching orgasm but every other day dosing is helping.

## 2020-12-20 NOTE — Assessment & Plan Note (Signed)
Well-controlled hypertension.  Continue Zestoretic 10-25 mg daily. Well-controlled diabetes with hemoglobin A1c of 6.3. Continue metformin 1000 mg twice a day, glipizide 10 mg in the morning, Tresiba 25 units at bedtime, and Trulicity 1.5 mg weekly.  No changes. Diet and nutrition discussed. Follow-up in 6 months.

## 2020-12-20 NOTE — Patient Instructions (Signed)
Diabetes mellitus y nutricin, en adultos Diabetes Mellitus and Nutrition, Adult Si sufre de diabetes, o diabetes mellitus, es muy importante tener hbitos alimenticios saludables debido a que sus niveles de Designer, television/film set sangre (glucosa) se ven afectados en gran medida por lo que come y bebe. [place] alimentos saludables en las cantidades correctas, aproximadamente a la misma hora todos los [place], [place] ayudar a:  Aeronautical engineer glucemia.  Disminuir el riesgo de sufrir una enfermedad cardaca.  Mejorar la presin arterial.  Science writer o mantener un peso saludable. Qu puede afectar mi plan de alimentacin? Todas las personas que sufren de diabetes son diferentes y cada una tiene necesidades diferentes en cuanto a un plan de alimentacin. El mdico puede recomendarle que trabaje con un nutricionista para elaborar el mejor plan para usted. Su plan de alimentacin puede variar segn factores como:  Las caloras que necesita.  Los medicamentos que toma.  Su peso.  Sus niveles de glucemia, presin arterial y colesterol.  Su nivel de [place].  Otras afecciones que tenga, como enfermedades cardacas o renales. Cmo me afectan los carbohidratos? Los carbohidratos, o hidratos de carbono, afectan su nivel de glucemia ms que cualquier otro tipo de alimento. La ingesta de carbohidratos naturalmente aumenta la cantidad de [employer]. El recuento de carbohidratos es un mtodo destinado a Catering manager un registro de la cantidad de carbohidratos que se consumen. El recuento de carbohidratos es importante para Theatre manager la glucemia a un nivel saludable, especialmente si utiliza insulina o toma determinados medicamentos por va oral para la diabetes. Es importante conocer la cantidad de carbohidratos que se pueden ingerir en cada comida sin correr Engineer, manufacturing. Esto es Psychologist, forensic. Su nutricionista puede ayudarlo a calcular la cantidad de carbohidratos que debe ingerir en cada comida y en cada  refrigerio. Cmo me afecta el alcohol? El alcohol puede provocar disminuciones sbitas de la glucemia (hipoglucemia), especialmente si utiliza insulina o toma determinados medicamentos por va oral para la diabetes. La hipoglucemia es una afeccin potencialmente mortal. Los sntomas de la hipoglucemia, como somnolencia, mareos y confusin, son similares a los sntomas de haber consumido demasiado alcohol.  No beba alcohol si: ? Su mdico le indica no hacerlo. ? Est embarazada, puede estar embarazada o est tratando de quedar embarazada.  Si bebe alcohol: ? No beba con el estmago vaco. ? Limite la cantidad que bebe:  De 0 a 1 medida por da para las mujeres.  De 0 a 2 medidas por da para los hombres. ? Est atento a la cantidad de alcohol que hay en las bebidas que toma. En los [place], una medida equivale a una botella de cerveza de 12oz (368m), un vaso de vino de 5oz (1468m o un vaso de una bebida alcohlica de alta graduacin de 1oz (4485m ? Mantngase hidratado bebiendo agua, refrescos dietticos o t helado sin azcar.  Tenga en cuenta que los refrescos comunes, los jugos y otras bebida para mezOptician, dispensingeden contener mucha azcar y se deben contar como carbohidratos. Consejos para seguir estPhotographers etiquetas de los alimentos  Comience por leer el tamao de la porcin en la "Informacin nutricional" en las etiquetas de los alimentos envasados y las bebidas. La cantidad de caloras, carbohidratos, grasas y otros nutrientes mencionados en la etiqueta se basan en una porcin del alimento. Muchos alimentos contienen ms de una porcin por envase.  Verifique la cantidad total de gramos (g) de carbohidratos totales en una porcin. Puede calcular la cantidad de porciones  de carbohidratos al dividir el total de carbohidratos por 15. Por ejemplo, si un alimento tiene un total de 30g de carbohidratos totales por porcin, equivale a 2 porciones de  carbohidratos.  Verifique la cantidad de gramos (g) de grasas saturadas y grasas trans de una porcin. Escoja alimentos que no contengan estas grasas o que su contenido de estas sea [place].  Verifique la cantidad de miligramos (mg) de sal (sodio) en una porcin. [place] [employer] de las personas deben limitar la ingesta de sodio total a menos de 2333m por dTraining and development officer  Siempre consulte la informacin nutricional de los alimentos etiquetados como "con bajo contenido de grasa" o "sin grasa". Estos alimentos pueden tener un mayor contenido de aLocation manageragregada o carbohidratos refinados, y deben evitarse.  Hable con su nutricionista para identificar sus objetivos diarios en cuanto a los nutrientes mencionados en la etiqueta. Al ir de compras  Evite comprar alimentos procesados, enlatados o precocidos. Estos alimentos tienden a tSpecial educational needs teachermayor cantidad de gF[place] sodio y azcar agregada.  Compre en la zona exterior de la tienda de comestibles. Esta es la zona donde se encuentran con mayor frecuencia las frutas y las verduras frescas, los cereales a granel, las carnes frescas y los productos lcteos frescos. Al cocinar  Utilice mtodos de coccin a baja temperatura, como hornear, en lugar de mtodos de coccin a alta temperatura, como frer en abundante aceite.  Cocine con aceites saludables, como el aceite de oFrontier canola o gPiketon  Evite cocinar con manteca, crema o carnes con alto contenido de grasa. Planificacin de las comidas  Coma las comidas y los refrigerios regularmente, preferentemente a la misma hora todos lK[place] Evite pasar largos perodos de tiempo sin comer.  Consuma alimentos ricos en fibra, como frutas frescas, verduras, frijoles y cereales integrales. Consulte a su nutricionista sobre cuntas porciones de carbohidratos puede consumir en cada comida.  Consuma entre 4 y 6 onzas (entre 112 y 168g) de protenas magras por da, como carnes magras, pollo, pescado, huevos o tofu. Una onza (oz) de  protena magra equivale a: ? 1 onza (28g) de carne, pollo o pescado. ? 1huevo. ?  de taza (62 g) de tofu.  Coma algunos alimentos por da que contengan grasas saludables, como aguacates, frutos secos, semillas y pescado.   Qu alimentos debo comer? FLambert ModyBayas. Manzanas. Naranjas. Duraznos. Damascos. Ciruelas. Uvas. Mango. Papaya. GTolna Kiwi. Cerezas. VHolland CommonsLValeda Malm Espinaca. Verduras de hBoeing que incluyen col rizada, aFolsom hojas de bIraqy de mFountain Remolachas. Coliflor. Repollo. Brcoli. Zanahorias. Judas verdes. Tomates. Pimientos. Cebollas. Pepinos. Coles de Bruselas. Granos Granos integrales, como panes, galletas, tortillas, cereales y pastas de salvado o integrales. Avena sin azcar. Quinua. Arroz integral o salvaje. Carnes y oPsychiatric nurse Carne de ave sin piel. Cortes magros de ave y carne de res. Tofu. Frutos secos. Semillas. Lcteos Productos lcteos sin grasa o con bajo contenido de gPepin cUpper Stewartsville yogur y qKahaluu-Keauhou Es posible que los productos que se enumeran ms aNew Caledoniano constituyan una lista completa de los alimentos y las bebidas que puede tomar. Consulte a un nutricionista para obtener ms informacin. Qu alimentos debo evitar? FLambert ModyFrutas enlatadas al almbar. Verduras Verduras enlatadas. Verduras congeladas con mantequilla o salsa de crema. Granos Productos elaborados con hIsraely hLao People's Democratic Republic como panes, pastas, bocadillos y cereales. Evite todos los alimentos procesados. Carnes y otras protenas Cortes de carne con alto contenido de gLobbyist Carne de ave con piel. Carnes empanizadas o fritas. Carne procesada. Evite las grasas saturadas.  los alimentos y las bebidas que debe evitar. Consulte a un nutricionista para obtener ms informacin. Preguntas para hacerle al mdico Es necesario que me rena con un instructor en el cuidado  de la diabetes? Es necesario que me rena con un nutricionista? A qu nmero puedo llamar si tengo preguntas? Cules son los mejores momentos para controlar la glucemia? Dnde encontrar ms informacin: Asociacin Estadounidense de la Diabetes (American Diabetes Association): diabetes.org Academy of Nutrition and Dietetics (Academia de Nutricin y Diettica): www.eatright.org National Institute of Diabetes and Digestive and Kidney Diseases (Instituto Nacional de la Diabetes y las Enfermedades Digestivas y Renales): www.niddk.nih.gov Association of Diabetes Care and Education Specialists (Asociacin de Especialistas en Atencin y Educacin sobre la Diabetes): www.diabeteseducator.org Resumen Es importante tener hbitos alimenticios saludables debido a que sus niveles de azcar en la sangre (glucosa) se ven afectados en gran medida por lo que come y bebe. Un plan de alimentacin saludable lo ayudar a controlar la glucemia y mantener un estilo de vida saludable. El mdico puede recomendarle que trabaje con un nutricionista para elaborar el mejor plan para usted. Tenga en cuenta que los carbohidratos (hidratos de carbono) y el alcohol tienen efectos inmediatos en sus niveles de glucemia. Es importante contar los carbohidratos que ingiere y consumir alcohol con prudencia. Esta informacin no tiene como fin reemplazar el consejo del mdico. Asegrese de hacerle al mdico cualquier pregunta que tenga. Document Revised: 09/17/2019 Document Reviewed: 09/17/2019 Elsevier Patient Education  2021 Elsevier Inc.  

## 2020-12-20 NOTE — Assessment & Plan Note (Signed)
Stable assessment by oncologist last month.  Office notes reviewed.  No concerns.
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
## 2020-12-23 ENCOUNTER — Other Ambulatory Visit: Payer: Self-pay

## 2020-12-23 ENCOUNTER — Inpatient Hospital Stay: Payer: Medicare HMO | Admitting: Hematology

## 2020-12-23 ENCOUNTER — Inpatient Hospital Stay: Payer: Medicare HMO | Attending: Hematology

## 2020-12-23 VITALS — BP 117/77 | HR 81 | Temp 97.8°F | Resp 18 | Ht 64.0 in | Wt 189.6 lb

## 2020-12-23 DIAGNOSIS — D696 Thrombocytopenia, unspecified: Secondary | ICD-10-CM | POA: Insufficient documentation

## 2020-12-23 DIAGNOSIS — Z8572 Personal history of non-Hodgkin lymphomas: Secondary | ICD-10-CM | POA: Insufficient documentation

## 2020-12-23 DIAGNOSIS — C8581 Other specified types of non-Hodgkin lymphoma, lymph nodes of head, face, and neck: Secondary | ICD-10-CM | POA: Diagnosis not present

## 2020-12-23 LAB — CBC WITH DIFFERENTIAL (CANCER CENTER ONLY)
Abs Immature Granulocytes: 0.01 10*3/uL (ref 0.00–0.07)
Basophils Absolute: 0 10*3/uL (ref 0.0–0.1)
Basophils Relative: 1 %
Eosinophils Absolute: 0.1 10*3/uL (ref 0.0–0.5)
Eosinophils Relative: 2 %
HCT: 41.5 % (ref 39.0–52.0)
Hemoglobin: 14 g/dL (ref 13.0–17.0)
Immature Granulocytes: 0 %
Lymphocytes Relative: 23 %
Lymphs Abs: 1.3 10*3/uL (ref 0.7–4.0)
MCH: 29 pg (ref 26.0–34.0)
MCHC: 33.7 g/dL (ref 30.0–36.0)
MCV: 85.9 fL (ref 80.0–100.0)
Monocytes Absolute: 0.4 10*3/uL (ref 0.1–1.0)
Monocytes Relative: 7 %
Neutro Abs: 3.7 10*3/uL (ref 1.7–7.7)
Neutrophils Relative %: 67 %
Platelet Count: 71 10*3/uL — ABNORMAL LOW (ref 150–400)
RBC: 4.83 MIL/uL (ref 4.22–5.81)
RDW: 12.8 % (ref 11.5–15.5)
WBC Count: 5.5 10*3/uL (ref 4.0–10.5)
nRBC: 0 % (ref 0.0–0.2)

## 2020-12-23 LAB — CMP (CANCER CENTER ONLY)
ALT: 16 U/L (ref 0–44)
AST: 23 U/L (ref 15–41)
Albumin: 4.3 g/dL (ref 3.5–5.0)
Alkaline Phosphatase: 73 U/L (ref 38–126)
Anion gap: 12 (ref 5–15)
BUN: 13 mg/dL (ref 8–23)
CO2: 26 mmol/L (ref 22–32)
Calcium: 9.4 mg/dL (ref 8.9–10.3)
Chloride: 102 mmol/L (ref 98–111)
Creatinine: 0.65 mg/dL (ref 0.61–1.24)
GFR, Estimated: >60 mL/min (ref >60)
Glucose, Bld: 138 mg/dL — ABNORMAL HIGH (ref 70–99)
Potassium: 3.9 mmol/L (ref 3.5–5.1)
Sodium: 140 mmol/L (ref 135–145)
Total Bilirubin: 0.5 mg/dL (ref 0.3–1.2)
Total Protein: 6.6 g/dL (ref 6.5–8.1)

## 2020-12-23 LAB — IMMATURE PLATELET FRACTION: Immature Platelet Fraction: 6 % (ref 1.2–8.6)

## 2020-12-27 ENCOUNTER — Telehealth: Payer: Self-pay | Admitting: Hematology

## 2020-12-27 NOTE — Telephone Encounter (Signed)
Scheduled follow-up appointment per 4/29 los. Patient is aware. 

## 2020-12-28 ENCOUNTER — Other Ambulatory Visit: Payer: Self-pay | Admitting: Emergency Medicine

## 2021-03-14 ENCOUNTER — Ambulatory Visit: Payer: Medicare HMO | Admitting: Emergency Medicine

## 2021-03-23 ENCOUNTER — Other Ambulatory Visit: Payer: Self-pay | Admitting: Emergency Medicine

## 2021-03-23 DIAGNOSIS — E1165 Type 2 diabetes mellitus with hyperglycemia: Secondary | ICD-10-CM

## 2021-03-27 ENCOUNTER — Ambulatory Visit: Payer: Medicare HMO | Admitting: Emergency Medicine

## 2021-04-05 ENCOUNTER — Ambulatory Visit: Payer: Medicare HMO | Admitting: Emergency Medicine

## 2021-04-11 ENCOUNTER — Ambulatory Visit (INDEPENDENT_AMBULATORY_CARE_PROVIDER_SITE_OTHER): Payer: Medicare HMO | Admitting: Emergency Medicine

## 2021-04-11 ENCOUNTER — Other Ambulatory Visit: Payer: Self-pay

## 2021-04-11 VITALS — BP 136/80 | HR 86 | Temp 98.5°F | Ht 64.0 in | Wt 189.0 lb

## 2021-04-11 DIAGNOSIS — C8591 Non-Hodgkin lymphoma, unspecified, lymph nodes of head, face, and neck: Secondary | ICD-10-CM

## 2021-04-11 DIAGNOSIS — I152 Hypertension secondary to endocrine disorders: Secondary | ICD-10-CM | POA: Diagnosis not present

## 2021-04-11 DIAGNOSIS — N402 Nodular prostate without lower urinary tract symptoms: Secondary | ICD-10-CM

## 2021-04-11 DIAGNOSIS — M79672 Pain in left foot: Secondary | ICD-10-CM

## 2021-04-11 DIAGNOSIS — E1159 Type 2 diabetes mellitus with other circulatory complications: Secondary | ICD-10-CM

## 2021-04-11 LAB — POCT GLYCOSYLATED HEMOGLOBIN (HGB A1C): Hemoglobin A1C: 7.1 % — AB (ref 4.0–5.6)

## 2021-04-11 NOTE — Assessment & Plan Note (Addendum)
Well-controlled hypertension.  Continue Zestoretic 10- 25 mg daily. Hemoglobin A1c higher than before at 7.1. Continue metformin 1000 mg twice a day. May have had hypoglycemic episodes in the past as per his description. Stop glipizide. Increase Tresiba to 30units daily. Continue Trulicity 1.5 mg weekly. Diet and nutrition discussed.  Continue atorvastatin 40 mg daily. Follow-up in 3 months.
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
## 2021-04-11 NOTE — Patient Instructions (Signed)
Stop glipizide Increase Tresiba to 30 units Continue metformin and Trulicity Diabetes mellitus y nutricin, en adultos Diabetes Mellitus and Nutrition, Adult Si sufre de diabetes, o diabetes mellitus, es muy importante tener hbitos alimenticios saludables debido a que sus niveles de Designer, television/film set sangre (glucosa) se ven afectados en gran medida por lo que come y bebe. [place] alimentos saludables en las cantidades correctas, aproximadamente a la misma hora todos los [place], [place] ayudar a: Aeronautical engineer glucemia. Disminuir el riesgo de sufrir una enfermedad cardaca. Mejorar la presin arterial. Science writer o mantener un peso saludable. Qu puede afectar mi plan de alimentacin? Todas las personas que sufren de diabetes son diferentes y cada una tiene necesidades diferentes en cuanto a un plan de alimentacin. El mdico puede recomendarle que trabaje con un nutricionista para elaborar el mejor plan para usted. Su plan de alimentacin puede variar segn factores como: Las caloras que necesita. Los medicamentos que toma. Su peso. Sus niveles de glucemia, presin arterial y colesterol. Su nivel de [place]. Otras afecciones que tenga, como enfermedades cardacas o renales. Cmo me afectan los carbohidratos? Los carbohidratos, o hidratos de carbono, afectan su nivel de glucemia ms que cualquier otro tipo de alimento. La ingesta de carbohidratos naturalmente aumenta la cantidad de [employer]. El recuento de carbohidratos es un mtodo destinado a Catering manager un registro de la cantidad de carbohidratos que se consumen. El recuento de carbohidratos es importante para Theatre manager la glucemia a un nivel saludable, especialmente si utiliza insulina o toma determinadosmedicamentos por va oral para la diabetes. Es importante conocer la cantidad de carbohidratos que se pueden ingerir en cada comida sin correr Engineer, manufacturing. Esto es Psychologist, forensic. Su nutricionista puede ayudarlo a calcular la cantidad  de carbohidratos que debeingerir en cada comida y en cada refrigerio. Cmo me afecta el alcohol? El alcohol puede provocar disminuciones sbitas de la glucemia (hipoglucemia), especialmente si utiliza insulina o toma determinados medicamentos por va oral para la diabetes. La hipoglucemia es una afeccin potencialmente mortal. Los sntomas de la hipoglucemia, como somnolencia, mareos y confusin, son similares a los sntomas de haber consumido demasiado alcohol. No beba alcohol si: Su mdico le indica no hacerlo. Est embarazada, puede estar embarazada o est tratando de [place]. Si bebe alcohol: No beba con el estmago vaco. Limite la cantidad que bebe: De 0 a 1 medida por da para las mujeres. De 0 a 2 medidas por da para los hombres. Est atento a la cantidad de alcohol que hay en las bebidas que toma. En los [place], una medida equivale a una botella de cerveza de 12 oz (355 ml), un vaso de vino de 5 oz (148 ml) o un vaso de una bebida alcohlica de alta graduacin de 1 oz (44 ml). Mantngase hidratado bebiendo agua, refrescos dietticos o t helado sin azcar. Tenga en cuenta que los refrescos comunes, los jugos y otras bebida para Optician, dispensing pueden contener mucha azcar y se deben contar como carbohidratos. Consejos para seguir Company secretary las etiquetas de los alimentos Comience por leer el tamao de la porcin en la "Informacin nutricional" en las etiquetas de los alimentos envasados y las bebidas. La cantidad de caloras, carbohidratos, grasas y otros nutrientes mencionados en la etiqueta se basan en una porcin del alimento. Muchos alimentos contienen ms de una porcin por envase. Verifique la cantidad total de gramos (g) de carbohidratos totales en una porcin. Puede calcular la cantidad de porciones de carbohidratos al dividir el total de  carbohidratos por 15. Por ejemplo, si un alimento tiene un total de 30 g de carbohidratos totales por porcin, equivale a 2  porciones de carbohidratos. Verifique la cantidad de gramos (g) de grasas saturadas y grasas trans de una porcin. Escoja alimentos que no contengan estas grasas o que su contenido de estas sea [place]. Verifique la cantidad de miligramos (mg) de sal (sodio) en una porcin. La State Farm de las personas deben limitar la ingesta de sodio total a menos de 2300 mg Honeywell. Siempre consulte la informacin nutricional de los alimentos etiquetados como "con bajo contenido de grasa" o "sin grasa". Estos alimentos pueden tener un mayor contenido de Location manager agregada o carbohidratos refinados, y deben evitarse. Hable con su nutricionista para identificar sus objetivos diarios en cuanto a los nutrientes mencionados en la etiqueta. Al ir de compras Evite comprar alimentos procesados, enlatados o precocidos. Estos alimentos tienden a Special educational needs teacher mayor cantidad de [place], sodio y azcar agregada. Compre en la zona exterior de la tienda de comestibles. Esta es la zona donde se encuentran con mayor frecuencia las frutas y las verduras frescas, los cereales a granel, las carnes frescas y los productos lcteos frescos. Al cocinar Utilice mtodos de coccin a baja temperatura, como hornear, en lugar de mtodos de coccin a alta temperatura, como frer en abundante aceite. Cocine con aceites saludables, como el aceite de [place]al[place], canola o [place]. Evite cocinar con manteca, crema o carnes con alto contenido de grasa. Planificacin de las comidas Coma las comidas y los refrigerios regularmente, preferentemente a la misma hora todos [place]. Evite pasar largos perodos de tiempo sin comer. Consuma alimentos ricos en fibra, como frutas frescas, verduras, frijoles y cereales integrales. Consulte a su nutricionista sobre cuntas porciones de carbohidratos puede consumir en cada comida. Consuma entre 4 y 6 onzas (entre 112 y 168 g) de protenas magras por da, como carnes [place], pollo, pescado, huevos o tofu. Una onza (oz) de protena  magra equivale a: 1 onza (28 g) de carne, pollo o pescado. 1 huevo.  de taza (62 g) de tofu. Coma algunos alimentos por da que contengan grasas saludables, como aguacates, frutos secos, semillas y pescado. Qu alimentos debo comer? Lambert Mody Bayas. Manzanas. Naranjas. Duraznos. Damascos. Ciruelas. Uvas. Mango. Papaya.[place]. Kiwi. Cerezas. [place] Valeda Malm. Espinaca. Verduras de Boeing, que incluyen col rizada, [place], hojas de [place] y de [place]. Remolachas. Coliflor. Repollo. Brcoli. Zanahorias. Judas verdes. Tomates. Pimientos. Cebollas. Pepinos. Coles deBruselas. Granos Granos integrales, como panes, galletas, tortillas, cereales y pastas desalvado o integrales. Avena sin azcar. Quinua. Arroz integral o salvaje. Carnes y Psychiatric nurse. Carne de ave sin piel. Cortes magros de ave y carne de res. Tofu.Frutos secos. Semillas. Lcteos Productos lcteos sin grasa o con bajo contenido de [place], [place], yogur [place]. Es posible que los productos que se enumeran ms [place] no constituyan una lista completa de los alimentos y las bebidas que puede tomar. Consulte a un nutricionista para obtener ms informacin. Qu alimentos debo evitar? Lambert Mody Frutas enlatadas al almbar. Verduras Verduras enlatadas. Verduras congeladas con mantequilla o salsa de crema. Granos Productos elaborados con [place] y [place], como panes, pastas,bocadillos y cereales. Evite todos los alimentos procesados. Carnes y otras protenas Cortes de carne con alto contenido de Lobbyist. Carne de ave con piel. Carnesempanizadas o fritas. Carne procesada. Evite las grasas saturadas. Lcteos Yogur, [place] enteros. Bebidas Bebidas azucaradas, como gaseosas o t helado. Es posible que los productos que se enumeran ms [place] no constituyan [place]  lista completa de los alimentos y las bebidas que debe evitar. Consulte a un nutricionista para obtener ms informacin. Preguntas para hacerle  al mdico Es necesario que me rena con Radio broadcast assistant en el cuidado de la diabetes? Es necesario que me rena con un nutricionista? A qu nmero puedo llamar si tengo preguntas? Cules son los mejores momentos para controlar la glucemia? Dnde encontrar ms informacin: Asociacin Estadounidense de la Diabetes (American Diabetes Association): diabetes.org Academy of Nutrition and Dietetics (Academia de Nutricin y Information systems manager): www.eatright.Unisys Corporation of Diabetes and Digestive and Kidney Diseases (Dillard la Diabetes y Universal y Renales): DesMoinesFuneral.dk Association of Diabetes Care and Education Specialists (Asociacin de Especialistas en Atencin y Educacin sobre la Diabetes): www.diabeteseducator.org Resumen Es importante tener hbitos alimenticios saludables debido a que sus niveles de Designer, television/film set sangre (glucosa) se ven afectados en gran medida por lo que come y bebe. Un plan de alimentacin saludable lo ayudar a controlar la glucemia y Theatre manager un estilo de vida saludable. El mdico puede recomendarle que trabaje con un nutricionista para elaborar el mejor plan para usted. Tenga en cuenta que los carbohidratos (hidratos de carbono) y el alcohol tienen efectos inmediatos en sus niveles de glucemia. Es importante contar los carbohidratos que ingiere y consumir alcohol con prudencia. Esta informacin no tiene Marine scientist el consejo del mdico. Asegresede hacerle al mdico cualquier pregunta que tenga. Document Revised: 09/17/2019 Document Reviewed: 09/17/2019 Elsevier Patient Education  2021 [employer].

## 2021-04-13 ENCOUNTER — Encounter: Payer: Self-pay | Admitting: Sports Medicine

## 2021-04-13 ENCOUNTER — Other Ambulatory Visit: Payer: Self-pay | Admitting: Sports Medicine

## 2021-04-13 ENCOUNTER — Other Ambulatory Visit: Payer: Self-pay

## 2021-04-13 ENCOUNTER — Ambulatory Visit: Payer: Medicare HMO | Admitting: Sports Medicine

## 2021-04-13 ENCOUNTER — Ambulatory Visit (INDEPENDENT_AMBULATORY_CARE_PROVIDER_SITE_OTHER): Payer: Medicare HMO

## 2021-04-13 DIAGNOSIS — M722 Plantar fascial fibromatosis: Secondary | ICD-10-CM

## 2021-04-13 DIAGNOSIS — M2142 Flat foot [pes planus] (acquired), left foot: Secondary | ICD-10-CM

## 2021-04-13 DIAGNOSIS — M79672 Pain in left foot: Secondary | ICD-10-CM | POA: Diagnosis not present

## 2021-04-13 DIAGNOSIS — M2141 Flat foot [pes planus] (acquired), right foot: Secondary | ICD-10-CM | POA: Diagnosis not present

## 2021-04-13 MED ORDER — TRIAMCINOLONE ACETONIDE 10 MG/ML IJ SUSP
10.0000 mg | Freq: Once | INTRAMUSCULAR | Status: AC
  Administered 2021-04-13: 10 mg

## 2021-04-13 NOTE — Progress Notes (Signed)
Subjective: Damon Dudley is a 60 y.o. male patient presents to office with complaint of moderate heel pain on the left. Patient admits to post static dyskinesia for 1 month in duration. Patient has treated this problem with wearing sneakers more but still hurts, reports he had a similar pain when he was in [place] and injections helped in the past. Denies any other pedal complaints.   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 90 tablet 3   Dulaglutide 1.5 MG/0.5ML SOPN Inject 1.5 mg into the skin once a week. 6 mL 3   enalapril-hydrochlorothiazide (VASERETIC) 10-25 MG tablet Take 1 tablet by mouth daily. 90 tablet 3   insulin degludec (TRESIBA FLEXTOUCH) 100 UNIT/ML FlexTouch Pen Inject 25 Units into the skin at bedtime. 3 mL 5   Lancets (ONETOUCH DELICA PLUS Q000111Q) MISC TEST TWO TIMES A DAY. 200 each 1   metFORMIN (GLUCOPHAGE) 1000 MG tablet Take 1 tablet (1,000 mg total) by mouth 2 (two) times daily with a meal. 180 tablet 3   ONETOUCH VERIO test strip TEST TWO TIMES A DAY. 200 strip 1   tamsulosin (FLOMAX) 0.4 MG CAPS capsule Take 0.4 mg by mouth daily.     glipiZIDE (GLUCOTROL) 10 MG tablet Take 1 tablet (10 mg total) by mouth daily before breakfast. (Patient not taking: Reported on 04/13/2021) 90 tablet 3   No current facility-administered medications on file prior to visit.    No Known Allergies  Objective: Physical Exam General: The  patient is alert and oriented x3 in no acute distress.  Dermatology: Skin is warm, dry and supple bilateral lower extremities. Nails 1-10 are normal. There is no erythema, edema, no eccymosis, no open lesions present. Integument is otherwise unremarkable.  Vascular: Dorsalis Pedis pulse and Posterior Tibial pulse are 2/4 bilateral. Capillary fill time is immediate to all digits.  Neurological: Grossly intact to light touch bilateral.  Musculoskeletal: Tenderness to palpation at the medial and central calcaneal tubercale and through the insertion of the plantar fascia on the left foot. No pain with compression of calcaneus bilateral.  No pain with calf compression bilateral. +Pes planus. There is decreased Ankle joint range of motion bilateral. All other joints range of motion within normal limits bilateral. Strength 5/5 in all groups bilateral.   Gait: Unassisted, Antalgic avoid weight on left heel  Xray, Left foot:  Normal osseous mineralization. Joint spaces preserved. No fracture/dislocation/boney destruction. Calcaneal spur present with mild thickening of plantar fascia. No other soft tissue abnormalities or radiopaque foreign bodies.   Assessment and Plan: Problem List Items Addressed This Visit   None Visit Diagnoses     Plantar fasciitis of left foot    -  Primary   Relevant Orders   DG Foot Complete Left   Pain of left heel       Pes planus of both feet           -Complete examination performed.  -Xrays  reviewed -Discussed with patient in detail the condition of plantar fasciitis, how this occurs and general treatment options. Explained both conservative and surgical treatments.  -After oral consent and aseptic prep, injected a mixture containing 1 ml of 2%  plain lidocaine, 1 ml 0.5% plain marcaine, 0.5 ml of kenalog 10 and 0.5 ml of dexamethasone phosphate into left heel. Post-injection care discussed with patient.  -Recommended good supportive shoes and advised use of OTC  heel lifts as dispensed at today's visit. -Explained and dispensed to patient daily stretching exercises. -Recommend patient to ice affected area 1-2x daily. -Patient to return to office in 4 weeks for follow up or sooner if problems or questions arise.  Landis Martins, DPM

## 2021-04-21 ENCOUNTER — Other Ambulatory Visit: Payer: Self-pay

## 2021-04-21 DIAGNOSIS — D696 Thrombocytopenia, unspecified: Secondary | ICD-10-CM

## 2021-04-24 ENCOUNTER — Inpatient Hospital Stay: Payer: Medicare HMO | Attending: Hematology

## 2021-04-24 ENCOUNTER — Inpatient Hospital Stay: Payer: Medicare HMO | Admitting: Hematology

## 2021-04-24 ENCOUNTER — Other Ambulatory Visit: Payer: Self-pay

## 2021-04-24 VITALS — BP 113/77 | HR 87 | Temp 98.9°F | Resp 17 | Ht 64.0 in | Wt 186.7 lb

## 2021-04-24 DIAGNOSIS — D696 Thrombocytopenia, unspecified: Secondary | ICD-10-CM | POA: Diagnosis present

## 2021-04-24 DIAGNOSIS — Z79899 Other long term (current) drug therapy: Secondary | ICD-10-CM | POA: Diagnosis not present

## 2021-04-24 DIAGNOSIS — Z87891 Personal history of nicotine dependence: Secondary | ICD-10-CM | POA: Diagnosis not present

## 2021-04-24 DIAGNOSIS — E119 Type 2 diabetes mellitus without complications: Secondary | ICD-10-CM | POA: Diagnosis not present

## 2021-04-24 DIAGNOSIS — Z8572 Personal history of non-Hodgkin lymphomas: Secondary | ICD-10-CM | POA: Insufficient documentation

## 2021-04-24 DIAGNOSIS — Z794 Long term (current) use of insulin: Secondary | ICD-10-CM | POA: Diagnosis not present

## 2021-04-24 DIAGNOSIS — C8581 Other specified types of non-Hodgkin lymphoma, lymph nodes of head, face, and neck: Secondary | ICD-10-CM

## 2021-04-24 DIAGNOSIS — I10 Essential (primary) hypertension: Secondary | ICD-10-CM | POA: Diagnosis not present

## 2021-04-24 LAB — CBC WITH DIFFERENTIAL (CANCER CENTER ONLY)
Abs Immature Granulocytes: 0.02 10*3/uL (ref 0.00–0.07)
Basophils Absolute: 0 10*3/uL (ref 0.0–0.1)
Basophils Relative: 1 %
Eosinophils Absolute: 0.1 10*3/uL (ref 0.0–0.5)
Eosinophils Relative: 1 %
HCT: 44 % (ref 39.0–52.0)
Hemoglobin: 14.8 g/dL (ref 13.0–17.0)
Immature Granulocytes: 0 %
Lymphocytes Relative: 22 %
Lymphs Abs: 1.6 10*3/uL (ref 0.7–4.0)
MCH: 28.5 pg (ref 26.0–34.0)
MCHC: 33.6 g/dL (ref 30.0–36.0)
MCV: 84.8 fL (ref 80.0–100.0)
Monocytes Absolute: 0.5 10*3/uL (ref 0.1–1.0)
Monocytes Relative: 7 %
Neutro Abs: 5.3 10*3/uL (ref 1.7–7.7)
Neutrophils Relative %: 69 %
Platelet Count: 198 10*3/uL (ref 150–400)
RBC: 5.19 MIL/uL (ref 4.22–5.81)
RDW: 13.2 % (ref 11.5–15.5)
WBC Count: 7.6 10*3/uL (ref 4.0–10.5)
nRBC: 0 % (ref 0.0–0.2)

## 2021-04-24 LAB — CMP (CANCER CENTER ONLY)
ALT: 29 U/L (ref 0–44)
AST: 23 U/L (ref 15–41)
Albumin: 4.2 g/dL (ref 3.5–5.0)
Alkaline Phosphatase: 80 U/L (ref 38–126)
Anion gap: 13 (ref 5–15)
BUN: 13 mg/dL (ref 8–23)
CO2: 27 mmol/L (ref 22–32)
Calcium: 9.8 mg/dL (ref 8.9–10.3)
Chloride: 103 mmol/L (ref 98–111)
Creatinine: 0.83 mg/dL (ref 0.61–1.24)
GFR, Estimated: >60 mL/min (ref >60)
Glucose, Bld: 122 mg/dL — ABNORMAL HIGH (ref 70–99)
Potassium: 4 mmol/L (ref 3.5–5.1)
Sodium: 143 mmol/L (ref 135–145)
Total Bilirubin: 0.4 mg/dL (ref 0.3–1.2)
Total Protein: 6.7 g/dL (ref 6.5–8.1)

## 2021-04-24 LAB — IMMATURE PLATELET FRACTION: Immature Platelet Fraction: 2.8 % (ref 1.2–8.6)
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
## 2021-05-11 ENCOUNTER — Other Ambulatory Visit: Payer: Self-pay

## 2021-05-11 ENCOUNTER — Ambulatory Visit: Payer: Medicare HMO | Admitting: Sports Medicine

## 2021-05-11 DIAGNOSIS — M2142 Flat foot [pes planus] (acquired), left foot: Secondary | ICD-10-CM

## 2021-05-11 DIAGNOSIS — M79672 Pain in left foot: Secondary | ICD-10-CM

## 2021-05-11 DIAGNOSIS — M722 Plantar fascial fibromatosis: Secondary | ICD-10-CM | POA: Diagnosis not present

## 2021-05-11 DIAGNOSIS — M2141 Flat foot [pes planus] (acquired), right foot: Secondary | ICD-10-CM

## 2021-05-11 MED ORDER — TRIAMCINOLONE ACETONIDE 10 MG/ML IJ SUSP
10.0000 mg | Freq: Once | INTRAMUSCULAR | Status: AC
  Administered 2021-05-11: 10 mg

## 2021-05-11 NOTE — Progress Notes (Signed)
Subjective: Damon Dudley is a 60 y.o. male returns to office for follow up evaluation after Left heel injection for plantar fasciitis, injection #1 administered 3 to 4 weeks ago. Patient states that the injection seems to help his pain a little states that his plantar fascial still bothers him especially when he tries to wear other shoes other than tennis shoes. Patient denies any recent changes in medications or new problems since last visit.   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 90 tablet 3   Dulaglutide 1.5 MG/0.5ML SOPN Inject 1.5 mg into the skin once a week. 6 mL 3   enalapril-hydrochlorothiazide (VASERETIC) 10-25 MG tablet Take 1 tablet by mouth daily. 90 tablet 3   glipiZIDE (GLUCOTROL) 10 MG tablet Take 1 tablet (10 mg total) by mouth daily before breakfast. (Patient not taking: Reported on 04/13/2021) 90 tablet 3   insulin degludec (TRESIBA FLEXTOUCH) 100 UNIT/ML FlexTouch Pen Inject 25 Units into the skin at bedtime. 3 mL 5   Lancets (ONETOUCH DELICA PLUS Q000111Q) MISC TEST TWO TIMES A DAY. 200 each 1   metFORMIN (GLUCOPHAGE) 1000 MG tablet Take 1 tablet (1,000 mg total) by mouth 2 (two) times daily with a meal. 180 tablet 3   ONETOUCH VERIO test strip TEST TWO TIMES A DAY. 200 strip 1   tamsulosin (FLOMAX) 0.4 MG CAPS capsule Take 0.4 mg by mouth daily.     No current facility-administered medications on file prior to visit.     No Known Allergies  Objective:   General:  Alert and oriented x 3, in no acute distress  Dermatology: Skin is warm, dry, and supple bilateral. Nails are within normal limits. There is no lower extremity erythema, no eccymosis, no open lesions present bilateral.   Vascular: Dorsalis Pedis and Posterior Tibial pedal pulses are 1/4 bilateral. + hair growth noted bilateral. Capillary Fill Time is 3 seconds in all digits. No varicosities, No edema bilateral lower extremities.   Neurological: Sensation grossly intact to light touch bilateral.  Musculoskeletal: There is tenderness to palpation at the central greater than medial calcaneal tubercale and through the insertion of the plantar fascia on the Left foot. No pain with compression to calcaneus or application of tuning fork. There is decreased Ankle joint range of motion bilateral. All other jointsrange of motion  within normal limits bilateral.  Pes planus foot type.  Strength 5/5 bilateral.   Assessment and Plan: Problem List Items Addressed This Visit   None Visit Diagnoses     Plantar fasciitis of left foot    -  Primary   Pain of left heel       Pes planus of both feet           -Complete examination performed.  -Previous x-rays reviewed. -Discussed with patient in detail the condition of plantar fasciitis, how this  occurs related to the foot type of the patient and general treatment options. - Patient  opted for another injection today; After oral consent and aseptic prep, injected a mixture containing 1 ml of 1%plain lidocaine, 1 ml 0.5% plain marcaine, 0.5 ml of kenalog 10 and 0.5 ml of dexmethasone phosphate to left heel at area of most pain/trigger point injection.  This is injection #2 to the area. -Dispensed plantar fascial brace for the left for patient to use as directed -Continue with stretching, icing, good supportive shoes, heel lift daily -Discussed long term care and reocurrence; will closely monitor; if fails  to improve will consider other treatment modalities.  -Patient to return to office in 1 month for follow up or sooner if problems or questions arise.  Landis Martins, DPM

## 2021-05-18 ENCOUNTER — Ambulatory Visit: Payer: Medicare HMO | Admitting: Hematology

## 2021-05-18 ENCOUNTER — Other Ambulatory Visit: Payer: Medicare HMO

## 2021-06-08 ENCOUNTER — Other Ambulatory Visit: Payer: Self-pay

## 2021-06-08 ENCOUNTER — Ambulatory Visit: Payer: Medicare HMO | Admitting: Sports Medicine

## 2021-06-08 DIAGNOSIS — M79672 Pain in left foot: Secondary | ICD-10-CM

## 2021-06-08 DIAGNOSIS — L853 Xerosis cutis: Secondary | ICD-10-CM | POA: Diagnosis not present

## 2021-06-08 DIAGNOSIS — M722 Plantar fascial fibromatosis: Secondary | ICD-10-CM | POA: Diagnosis not present

## 2021-06-08 MED ORDER — NYSTATIN-TRIAMCINOLONE 100000-0.1 UNIT/GM-% EX OINT: 1.0000 "application " | TOPICAL_OINTMENT | Freq: Two times a day (BID) | CUTANEOUS | 0 refills | Status: DC

## 2021-06-08 NOTE — Progress Notes (Signed)
Subjective: Damon Dudley is a 61 y.o. male returns to office for follow up evaluation after Left heel injection for plantar fasciitis, injection #2 administered  4 weeks ago. Patient states that the injection helped currently has no pain to the heel but is concerned about the dry skin to both feet states that it is very sore and sensitive has been using foot cream as he has been given by his wife and states that he has been using a stone but it still is very sore and tender.  Denies active drainage, redness warmth swelling or signs of infection to the area.  Fasting blood sugar not recorded.  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 90 tablet 3   enalapril-hydrochlorothiazide (VASERETIC) 10-25 MG tablet Take 1 tablet by mouth daily. 90 tablet 3   glipiZIDE (GLUCOTROL) 10 MG tablet Take 1 tablet (10 mg total) by mouth daily before breakfast. 90 tablet 3   insulin degludec (TRESIBA FLEXTOUCH) 100 UNIT/ML FlexTouch Pen Inject 25 Units into the skin at bedtime. 3 mL 5   metFORMIN (GLUCOPHAGE) 1000 MG tablet Take 1 tablet (1,000 mg total) by mouth 2 (two) times daily with a meal. 180 tablet 3   ONETOUCH VERIO test strip TEST TWO TIMES A DAY. 200 strip 1   No current facility-administered medications on file prior to visit.    No Known Allergies  Objective:   General:  Alert and oriented x 3, in no acute distress  Dermatology: Skin is warm, dry,  and supple bilateral.  Dry skin to the plantar forefoot left and right.  No opening or signs of infection.  Nails are within normal limits.  Vascular: Dorsalis Pedis and Posterior Tibial pedal pulses are 1/4 bilateral. + hair growth noted bilateral. Capillary Fill Time is 3 seconds in all digits. No varicosities, No edema bilateral lower extremities.   Neurological: Sensation grossly intact to light touch bilateral.  Musculoskeletal: There is no tenderness to palpation at the left heel at the plantar fascial insertion.  No pain with compression to calcaneus or application of tuning fork. There is decreased Ankle joint range of motion bilateral. All other jointsrange of motion  within normal limits bilateral.  Pes planus foot type.  Strength 5/5 bilateral.   Assessment and Plan: Problem List Items Addressed This Visit   None Visit Diagnoses     Plantar fasciitis of left foot    -  Primary   Pain of left heel       Dry skin           -Complete examination performed.  -Advised patient since heel pain is much improved may slowly wean from plantar fascial brace as directed -Continue with stretching, icing, good supportive shoes, heel lift daily to prevent reoccurrence -Discussed with patient care for dry skin -Prescribed Mycolog ointment to use as directed -Recommend in addition foot miracle cream after bath or shower -Patient to return to office in 2  months for follow-up evaluation and assessment of dry skin or sooner if problems or questions arise.  Landis Martins, DPM

## 2021-06-22 ENCOUNTER — Other Ambulatory Visit: Payer: Self-pay | Admitting: Emergency Medicine

## 2021-07-12 ENCOUNTER — Other Ambulatory Visit: Payer: Self-pay

## 2021-07-12 ENCOUNTER — Ambulatory Visit (INDEPENDENT_AMBULATORY_CARE_PROVIDER_SITE_OTHER): Payer: Medicare HMO | Admitting: Emergency Medicine

## 2021-07-12 ENCOUNTER — Encounter: Payer: Self-pay | Admitting: Emergency Medicine

## 2021-07-12 VITALS — BP 122/80 | HR 81 | Temp 98.2°F | Resp 98 | Ht 64.0 in | Wt 194.0 lb

## 2021-07-12 DIAGNOSIS — I152 Hypertension secondary to endocrine disorders: Secondary | ICD-10-CM

## 2021-07-12 DIAGNOSIS — Z23 Encounter for immunization: Secondary | ICD-10-CM | POA: Diagnosis not present

## 2021-07-12 DIAGNOSIS — C8591 Non-Hodgkin lymphoma, unspecified, lymph nodes of head, face, and neck: Secondary | ICD-10-CM | POA: Diagnosis not present

## 2021-07-12 DIAGNOSIS — E1159 Type 2 diabetes mellitus with other circulatory complications: Secondary | ICD-10-CM | POA: Diagnosis not present

## 2021-07-12 LAB — POCT GLYCOSYLATED HEMOGLOBIN (HGB A1C): Hemoglobin A1C: 6.6 % — AB (ref 4.0–5.6)

## 2021-07-12 NOTE — Assessment & Plan Note (Signed)
Well-controlled hypertension.  Continue Zestoretic 10-25 mg daily. BP Readings from Last 3 Encounters:  07/12/21 122/80  04/24/21 113/77  04/11/21 136/80  Well-controlled diabetes with hemoglobin A1c of 6.6. Continue Tresiba 30 units daily, Trulicity 1.5 mg weekly, and metformin 1000 mg twice a day. Diet and nutrition discussed. Follow-up in 6 months.

## 2021-07-12 NOTE — Patient Instructions (Signed)
Diabetes mellitus y nutricin, en adultos Diabetes Mellitus and Nutrition, Adult Si sufre de diabetes, o diabetes mellitus, es muy importante tener hbitos alimenticios saludables debido a que sus niveles de Designer, television/film set sangre (glucosa) se ven afectados en gran medida por lo que come y bebe. [place] alimentos saludables en las cantidades correctas, aproximadamente a la misma hora todos los [place], [place] ayudar a: Chief Technology Officer su glucemia. Disminuir el riesgo de sufrir una enfermedad cardaca. Mejorar la presin arterial. Science writer o mantener un peso saludable. Qu puede afectar mi plan de alimentacin? Todas las personas que sufren de diabetes son diferentes y cada una tiene necesidades diferentes en cuanto a un plan de alimentacin. El mdico puede recomendarle que trabaje con un nutricionista para elaborar el mejor plan para usted. Su plan de alimentacin puede variar segn factores como: Las caloras que necesita. Los medicamentos que toma. Su peso. Sus niveles de glucemia, presin arterial y colesterol. Su nivel de [place]. Otras afecciones que tenga, como enfermedades cardacas o renales. Cmo me afectan los carbohidratos? Los carbohidratos, o hidratos de carbono, afectan su nivel de glucemia ms que cualquier otro tipo de alimento. La ingesta de carbohidratos aumenta la cantidad de [employer]. Es importante conocer la cantidad de carbohidratos que se pueden ingerir en cada comida sin correr Engineer, manufacturing. Esto es Psychologist, forensic. Su nutricionista puede ayudarlo a calcular la cantidad de carbohidratos que debe ingerir en cada comida y en cada refrigerio. Cmo me afecta el alcohol? El alcohol puede provocar una disminucin de la glucemia (hipoglucemia), especialmente si [place] insulina o toma determinados medicamentos por va oral para la diabetes. La hipoglucemia es una afeccin potencialmente mortal. Los sntomas de la hipoglucemia, como somnolencia, mareos y confusin, son  similares a los sntomas de haber consumido demasiado alcohol. No beba alcohol si: Su mdico le indica no hacerlo. Est embarazada, puede estar embarazada o est tratando de [place]. Si bebe alcohol: Limite la cantidad que bebe a lo siguiente: De 0 a 1 medida por da para las mujeres. De 0 a 2 medidas por da para los hombres. Sepa cunta cantidad de alcohol hay en las bebidas que toma. En los [place], una medida equivale a una botella de cerveza de 12 oz (355 ml), un vaso de vino de 5 oz (148 ml) o un vaso de una bebida alcohlica de alta graduacin de 1 oz (44 ml). Mantngase hidratado bebiendo agua, refrescos dietticos o t helado sin azcar. Tenga en cuenta que los refrescos comunes, los jugos y otras bebidas para mezclar pueden contener Freight forwarder y se deben contar como carbohidratos. Consejos para seguir Photographer las etiquetas de los alimentos Comience por leer el tamao de la porcin en la etiqueta de Informacin nutricional de los alimentos envasados y las bebidas. La cantidad de caloras, carbohidratos, grasas y otros nutrientes detallados en la etiqueta se basan en una porcin del alimento. Muchos alimentos contienen ms de una porcin por envase. Verifique la cantidad total de gramos (g) de carbohidratos totales en una porcin. Verifique la cantidad de gramos de grasas saturadas y grasas trans en una porcin. Escoja alimentos que no contengan estas grasas o que su contenido de estas sea [place]. Verifique la cantidad de miligramos (mg) de sal (sodio) en una porcin. [place] [employer] de las personas deben limitar la ingesta de sodio total a menos de 2300 mg Honeywell. Siempre consulte la informacin nutricional de los alimentos etiquetados como "con bajo contenido de grasa" o "sin grasa". Estos  alimentos pueden tener un mayor contenido de Location manager agregada o carbohidratos refinados, y deben evitarse. Hable con su nutricionista para identificar sus objetivos diarios en cuanto  a los nutrientes mencionados en la etiqueta. Al ir de compras Evite comprar alimentos procesados, enlatados o precocidos. Estos alimentos tienden a Special educational needs teacher mayor cantidad de [place], sodio y azcar agregada. Compre en la zona exterior de la tienda de comestibles. Esta es la zona donde se encuentran con mayor frecuencia las frutas y las verduras frescas, los cereales a granel, las carnes frescas y los productos lcteos frescos. Al cocinar Use mtodos de coccin a baja temperatura, como hornear, en lugar de mtodos de coccin a alta temperatura, como frer en abundante aceite. Cocine con aceites saludables, como el aceite de [place], canola o [place]. Evite cocinar con manteca, crema o carnes con alto contenido de grasa. Planificacin de las comidas Coma las comidas y los refrigerios regularmente, preferentemente a la misma hora todos [place]. Evite pasar largos perodos de tiempo sin comer. Consuma alimentos ricos en fibra, como frutas frescas, verduras, frijoles y cereales integrales. Consuma entre 4 y 6 onzas (entre 112 y 168 g) de protenas magras por da, como carnes [place], pollo, pescado, huevos o tofu. Una onza (oz) (28 g) de protena magra equivale a: 1 onza (28 g) de carne, pollo o pescado. 1 huevo.  taza (62 g) de tofu. Coma algunos alimentos por da que contengan grasas saludables, como aguacates, frutos secos, semillas y pescado. Qu alimentos debo comer? Lambert Mody Bayas. Manzanas. Naranjas. Duraznos. Damascos. Ciruelas. Uvas. Mangos. Papayas. Granadas. Kiwi. Cerezas. Verduras Verduras de Boeing, que incluyen [place], [place], col rizada, acelga, hojas de berza, hojas de mostaza y repollo. Remolachas. Coliflor. Brcoli. Zanahorias. Judas verdes. Tomates. Pimientos. Cebollas. Pepinos. Coles de Bruselas. Granos Granos integrales, como panes, galletas, tortillas, cereales y pastas de salvado o integrales. Avena sin azcar. Quinua. Arroz integral o salvaje. Carnes y otras  protenas Frutos de mar. Carne de ave sin piel. Cortes magros de ave y carne de res. Tofu. Frutos secos. Semillas. Lcteos Productos lcteos sin grasa o con bajo contenido de [place], [place], yogur y Chloride. Es posible que los productos detallados arriba no constituyan una lista completa de los alimentos y las bebidas que puede tomar. Consulte a un nutricionista para obtener ms informacin. Qu alimentos debo evitar? Lambert Mody Frutas enlatadas al almbar. Verduras Verduras enlatadas. Verduras congeladas con mantequilla o salsa de crema. Granos Productos elaborados con [place] y [place], como panes, pastas, bocadillos y cereales. Evite todos los alimentos procesados. Carnes y otras protenas Cortes de carne con alto contenido de Lobbyist. Carne de ave con piel. Carnes empanizadas o fritas. Carne procesada. Evite las grasas saturadas. Lcteos Yogur, [place] enteros. Bebidas Bebidas azucaradas, como gaseosas o t helado. Es posible que los productos que se enumeran ms [place] no constituyan una lista completa de los alimentos y las bebidas que Nurse, adult. Consulte a un nutricionista para obtener ms informacin. Preguntas para hacerle al mdico Debo consultar con un especialista certificado en atencin y educacin sobre la diabetes? Es necesario que me rena con un nutricionista? A qu nmero puedo llamar si tengo preguntas? Cules son los mejores momentos para controlar la glucemia? Dnde encontrar ms informacin: American Diabetes Association (Asociacin Estadounidense de la Diabetes): diabetes.org Academy of Nutrition and Dietetics (Academia de Nutricin y Information systems manager): eatright.Unisys Corporation of Diabetes and Digestive and Kidney Diseases ([place] la Diabetes y [place] y [place]): AmenCredit.is Association of Diabetes Care &  Education Specialists (Asociacin de Especialistas en Atencin y Educacin sobre la Diabetes):  diabeteseducator.org Resumen Es importante tener hbitos alimenticios saludables debido a que sus niveles de Designer, television/film set sangre (glucosa) se ven afectados en gran medida por lo que come y bebe. Es importante consumir alcohol con prudencia. Un plan de comidas saludable lo ayudar a controlar la glucosa en sangre y a reducir el riesgo de enfermedades cardacas. El mdico puede recomendarle que trabaje con un nutricionista para elaborar el mejor plan para usted. Esta informacin no tiene Marine scientist el consejo del mdico. Asegrese de hacerle al mdico cualquier pregunta que tenga. Document Revised: 04/20/2020 Document Reviewed: 04/20/2020 Elsevier Patient Education  2022 [employer].

## 2021-07-12 NOTE — Assessment & Plan Note (Signed)
Stable.  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 -01/29/2020 Flow Pathology Report [id number]) revealed " A lambda-restricted B-cell population comprises 50% of all lymphocytes." -01/29/2020 Surgical Pathology Report [id number]) revealed "SOFT TISSUE MASS, LEFT NECK, EXCISION: -  B-cell lymphoproliferative process, EBV positive."   2)Thrombocytopenia - PLT improved form 46k to 71k. ?ITP vs medication(newly on flomax). Thrombocytopenia has now resolved suggesting it could be transient from a passing vaccination or infection.   PLAN: -Discussed pt labwork today, thrombocytopenia has resolved CBC is otherwise within normal limits. -No lab or clinical evidence of Stage I NHL remaining at this time.      FOLLOW UP: RTC with Dr Irene Limbo with labs in 6 months . The total time spent in the appointment was 20 minutes and more than 50% was on counseling and direct patient cares.
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
## 2021-07-15 IMAGING — CT CT NECK W/ CM
3 of 4 series · 13 of 33 positions shown, 16 images · IV contrast (omnipaque)
Comparison: None.

CLINICAL DATA: Left-sided neck mass noted for the last 6 months.
Painful, particularly with talking.

EXAM:
CT NECK WITH CONTRAST
TECHNIQUE: Multidetector CT imaging of the neck was performed using the
standard protocol following the bolus administration of intravenous
contrast.
CONTRAST:  75mL OMNIPAQUE IOHEXOL 300 MG/ML  SOLN

[Series 2: axial neck · axial · 0.46mm/px · z∈[+96,+252]mm · 5 of 118 slices shown, 7 images]
[im 20/118  soft-tissue]
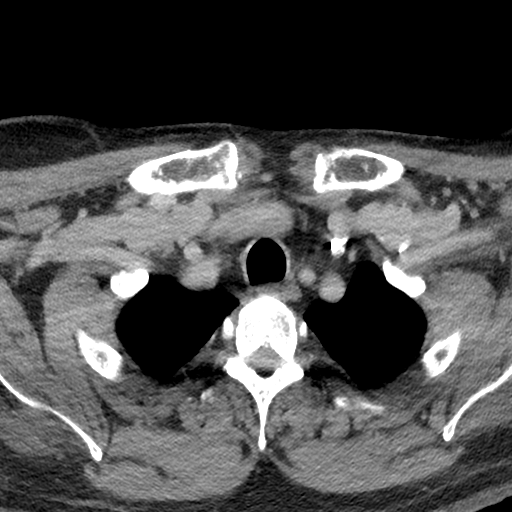
[im 20/118  bone]
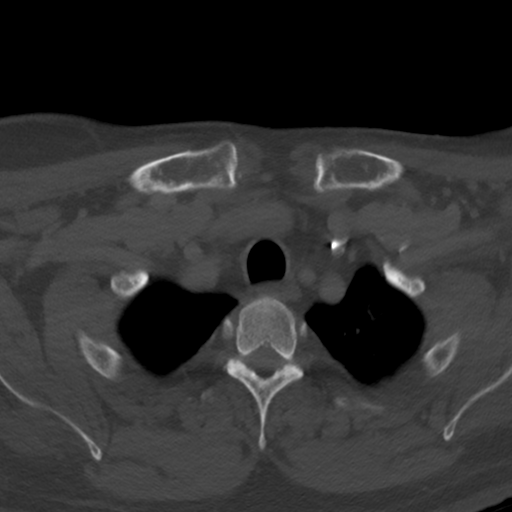
[im 40/118  bone]
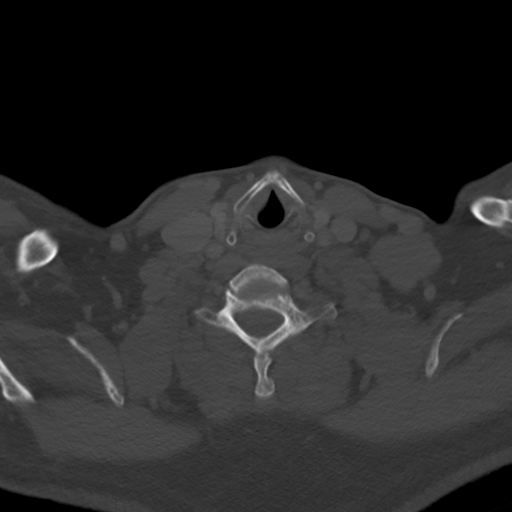
[im 59/118  bone]
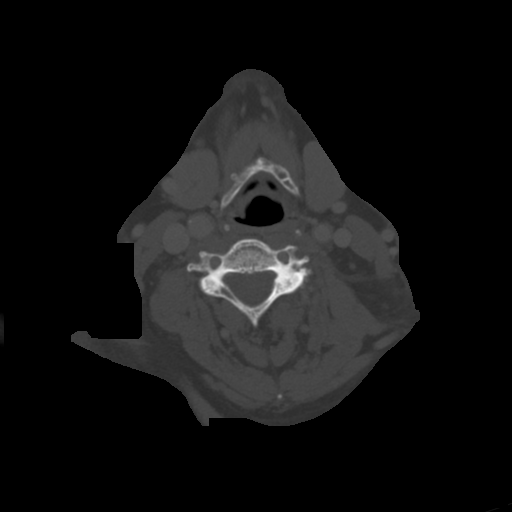
[im 79/118  bone]
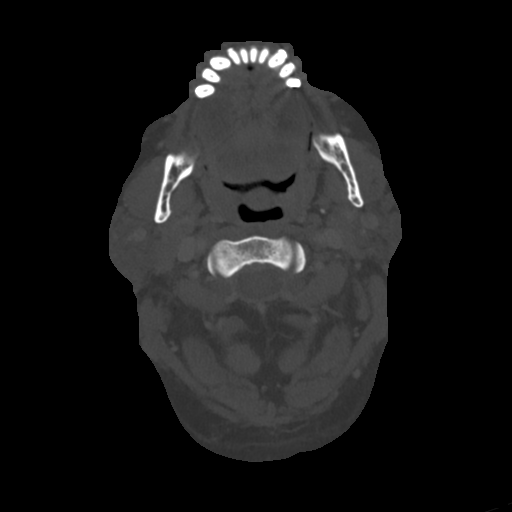
[im 98/118  soft-tissue]
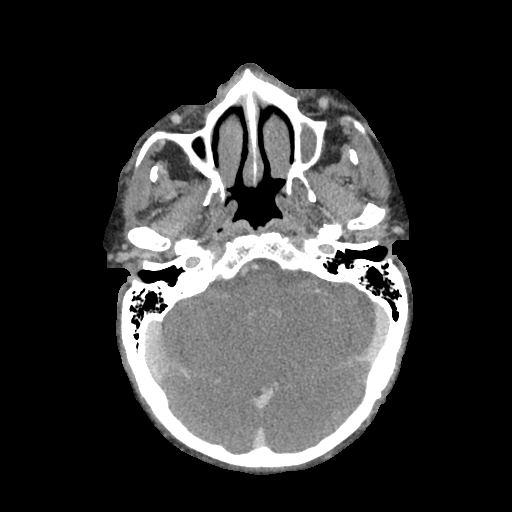
[im 98/118  bone]
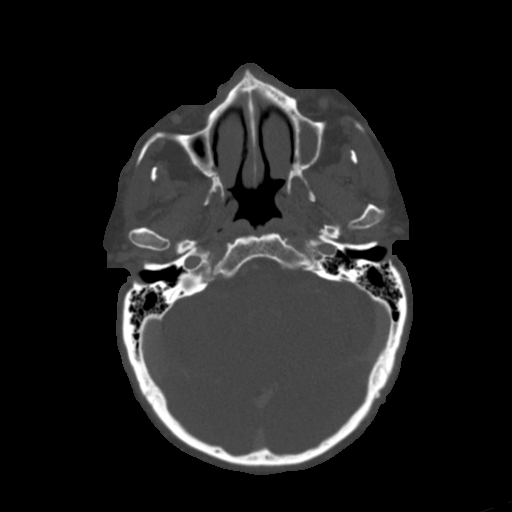

[Series 6: cor neck · coronal · 0.36mm/px · 3 of 101 slices shown]
[im 21/101  bone]
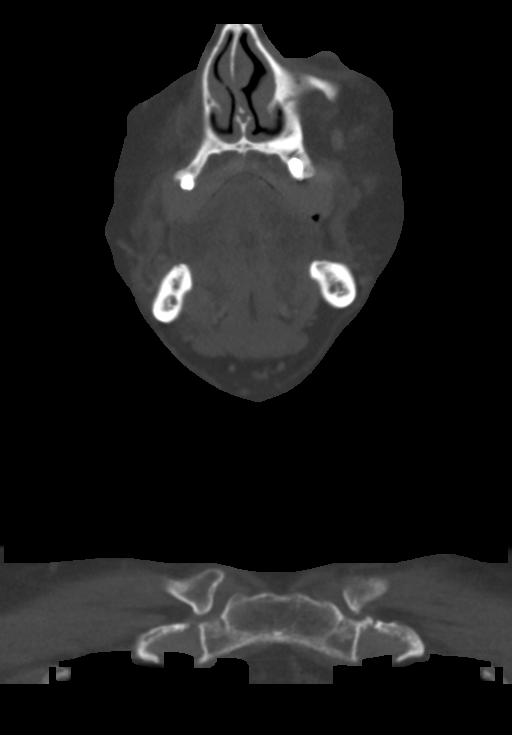
[im 41/101  bone]
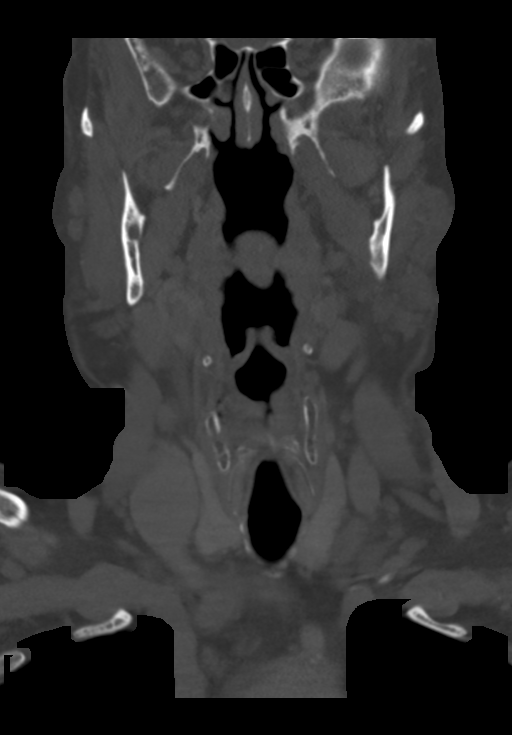
[im 61/101  bone]
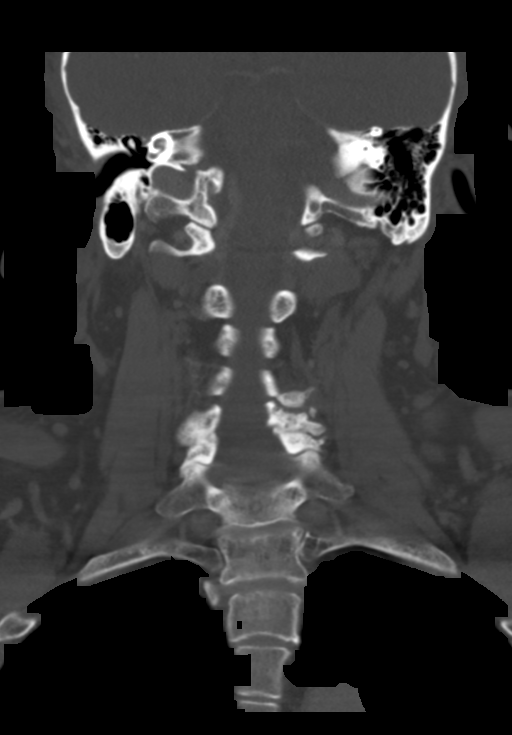

[Series 7: sag neck · sagittal · 0.43mm/px · 5 of 89 slices shown, 6 images]
[im 30/89  bone]
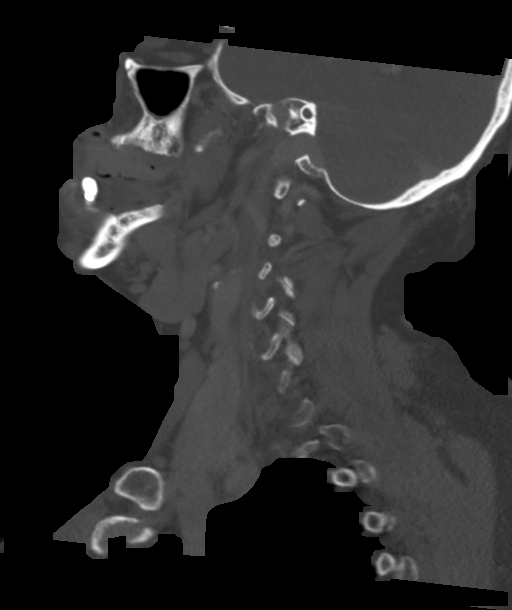
[im 37/89  bone]
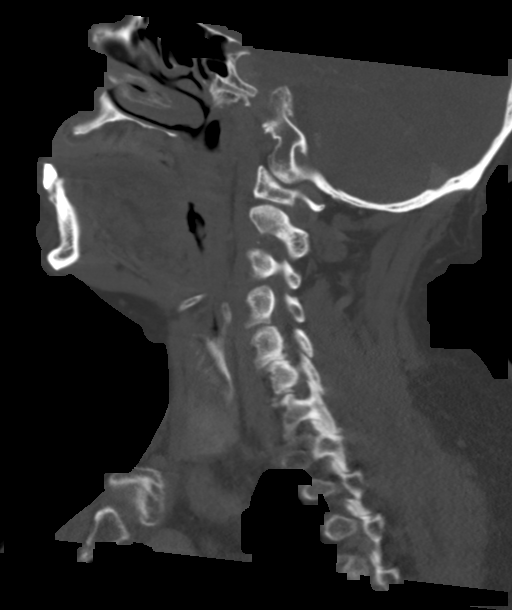
[im 45/89  soft-tissue]
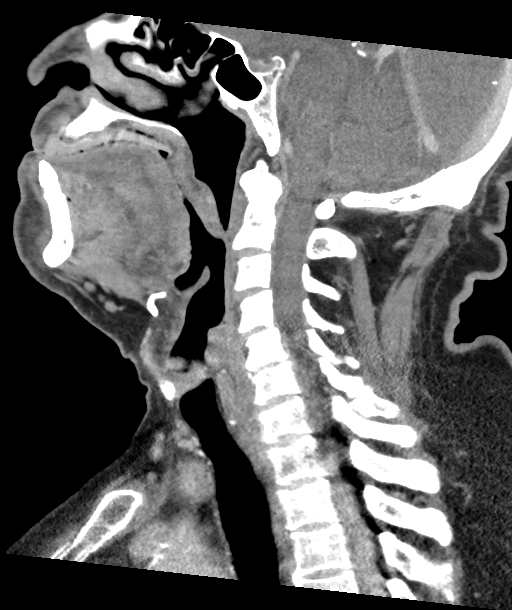
[im 45/89  bone]
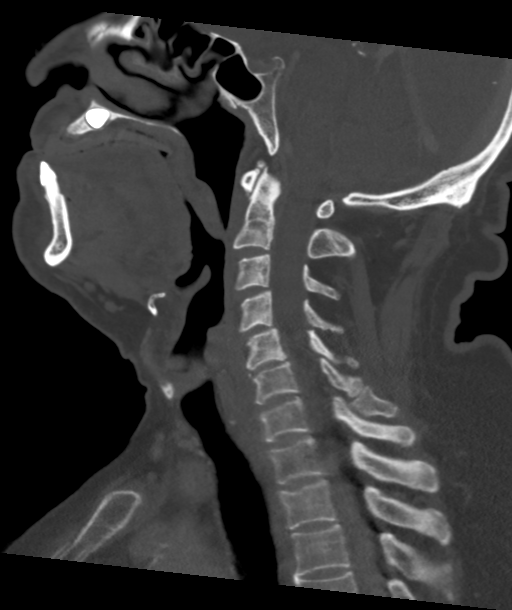
[im 52/89  bone]
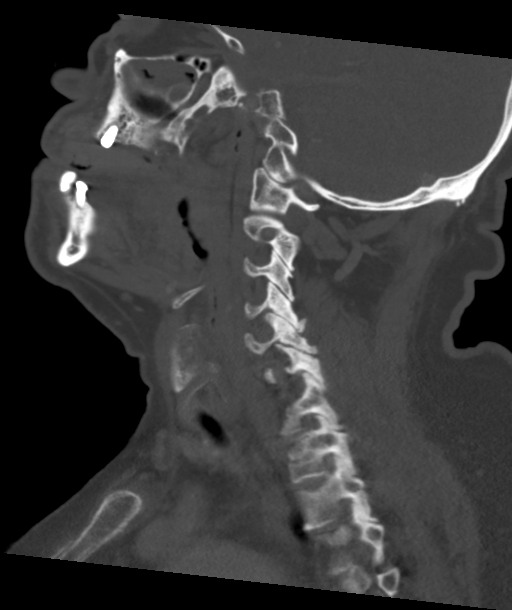
[im 59/89  bone]
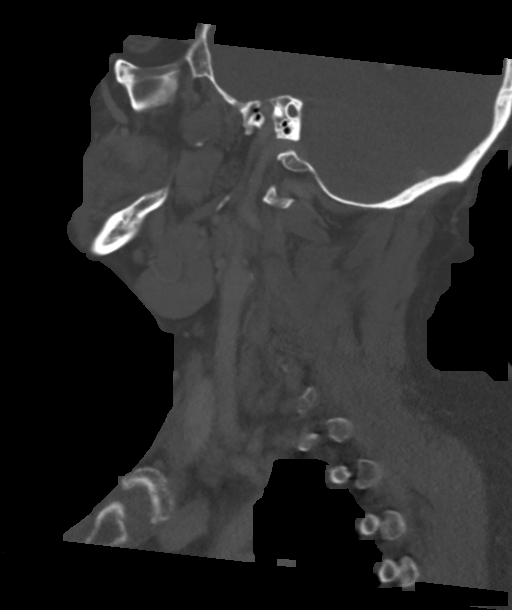

[13 of 33 positions shown; findings below may reference images not displayed]

FINDINGS: Pharynx and larynx: No mucosal or submucosal lesion is seen.

Salivary glands: Parotid and submandibular glands are normal.

Thyroid: Normal

Lymph nodes: No lymphadenopathy on the right. Lobular mass in the
supraclavicular region on the left measuring 5 cm right to left, 3
cm front to back and 3.5 cm cephalo caudal. This is presumed to
represent a lymph node mass. There is also some abnormal tissue that
probably represents abnormal lymph nodes lower at the thoracic inlet
on the left just lateral to the left lobe of the thyroid. This
tissue is indistinct but measures in total about 2 cm in size and is
presumed represent a manifestation of the same process. No other
enlarged nodes by size criteria on the left.

Vascular: Ordinary atherosclerotic change at the carotid
bifurcations.

Limited intracranial: Normal

Visualized orbits: Normal

Mastoids and visualized paranasal sinuses: Mucosal inflammatory
changes of the left maxillary sinus.

Skeleton: Mild cervical spondylosis.

Upper chest: Negative. No superior mediastinal lymphadenopathy. Lung
apices are clear.

Other: None
IMPRESSION: 5 x 3 x 3.5 cm lobular mass in the left supraclavicular region
presumed to represent a conglomeration of pathologic lymph nodes.
Second area of more indistinct but abnormal tissue lower in the
supraclavicular region, at the thoracic inlet level, just lateral to
the left lobe of the thyroid, measuring about 2 cm in size in total.
Differential diagnosis is metastatic disease versus lymphoma. No
evidence of superior mediastinal lymphadenopathy. No evidence of
mucosal primary tumor.

## 2021-08-03 ENCOUNTER — Encounter: Payer: Self-pay | Admitting: Sports Medicine

## 2021-08-03 ENCOUNTER — Ambulatory Visit: Payer: Medicare HMO | Admitting: Sports Medicine

## 2021-08-03 ENCOUNTER — Other Ambulatory Visit: Payer: Self-pay

## 2021-08-03 DIAGNOSIS — L853 Xerosis cutis: Secondary | ICD-10-CM | POA: Diagnosis not present

## 2021-08-03 DIAGNOSIS — M7741 Metatarsalgia, right foot: Secondary | ICD-10-CM | POA: Diagnosis not present

## 2021-08-03 DIAGNOSIS — L84 Corns and callosities: Secondary | ICD-10-CM

## 2021-08-03 DIAGNOSIS — M7742 Metatarsalgia, left foot: Secondary | ICD-10-CM | POA: Diagnosis not present

## 2021-08-03 NOTE — Progress Notes (Signed)
Subjective: Damon Dudley is a 60 y.o. male patient who presents to office for evaluation of dry skin to ball of left>right foot. States that his heel pain is gone but now has issues with more dry skin, foot miracle helps but when he tries to wear his dress shoes he can feel it more. Patient denies any other issues.   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 90 tablet 3   enalapril-hydrochlorothiazide (VASERETIC) 10-25 MG tablet Take 1 tablet by mouth daily. 90 tablet 3   glipiZIDE (GLUCOTROL) 10 MG tablet Take 1 tablet (10 mg total) by mouth daily before breakfast. 90 tablet 3   insulin degludec (TRESIBA FLEXTOUCH) 100 UNIT/ML FlexTouch Pen Inject 25 Units into the skin at bedtime. 3 mL 5   metFORMIN (GLUCOPHAGE) 1000 MG tablet Take 1 tablet (1,000 mg total) by mouth 2 (two) times daily with a meal. 180 tablet 3   nystatin ointment (MYCOSTATIN) Apply topically 2 (two) times daily.     nystatin-triamcinolone ointment (MYCOLOG) Apply 1 application topically 2 (two) times daily. 30 g 0   ONETOUCH VERIO test strip TEST TWO TIMES A DAY. 200 strip 1   tamsulosin (FLOMAX) 0.4 MG CAPS capsule Take 0.4 mg by mouth daily.     TRULICITY 1.5 GQ/6.7YP SOPN Inject into the skin.     No current facility-administered medications on file prior to visit.    No Known Allergies  Objective:  General: Alert and oriented x3 in no acute  distress  Dermatology: Minimal dry skin at ball and 1st toe on left> right, skin lines transversing the areas, minimal pain is present with direct pressure to the areas, no webspace macerations, no ecchymosis bilateral, all nails x 10 are well manicured.  Vascular: Dorsalis Pedis and Posterior Tibial pedal pulses 2/4, Capillary Fill Time 3 seconds, + pedal hair growth bilateral, no edema bilateral lower extremities, Temperature gradient within normal limits.  Neurology: Gross sensation intact via light touch bilateral.  Musculoskeletal: Mild tenderness to ball on L>R. No pain to left heel. Muscular strength 5/5 in all groups without pain or limitation on range of motion. No lower extremity muscular or boney deformity noted.  Assessment and Plan: Problem List Items Addressed This Visit   None Visit Diagnoses     Dry skin    -  Primary   left foot    Callus       Metatarsalgia of both feet           -Complete examination performed -Discussed treatment options -There was minimal dry skin/callus no paring done this visit -Encouraged daily skin emollients; foot miracle -Encouraged use of pumice stone -Advised good supportive shoes and inserts and met padding as dispensed this visit. Patient as this time does not want to try diabetic shoes again, states that they do not work for him -Patient to return to office as needed or sooner if condition worsens.  Landis Martins, DPM

## 2021-09-14 ENCOUNTER — Encounter: Payer: Self-pay | Admitting: Nurse Practitioner

## 2021-09-14 ENCOUNTER — Ambulatory Visit (INDEPENDENT_AMBULATORY_CARE_PROVIDER_SITE_OTHER): Payer: Medicare HMO | Admitting: Nurse Practitioner

## 2021-09-14 ENCOUNTER — Other Ambulatory Visit: Payer: Self-pay

## 2021-09-14 VITALS — BP 110/72 | HR 82 | Temp 98.5°F | Ht 64.0 in | Wt 187.0 lb

## 2021-09-14 DIAGNOSIS — R1032 Left lower quadrant pain: Secondary | ICD-10-CM | POA: Diagnosis not present

## 2021-09-14 DIAGNOSIS — Z1211 Encounter for screening for malignant neoplasm of colon: Secondary | ICD-10-CM

## 2021-09-14 LAB — CBC WITH DIFFERENTIAL/PLATELET
Basophils Absolute: 0 10*3/uL (ref 0.0–0.1)
Basophils Relative: 0.4 % (ref 0.0–3.0)
Eosinophils Absolute: 0.2 10*3/uL (ref 0.0–0.7)
Eosinophils Relative: 3.7 % (ref 0.0–5.0)
HCT: 41.7 % (ref 39.0–52.0)
Hemoglobin: 13.7 g/dL (ref 13.0–17.0)
Lymphocytes Relative: 19.9 % (ref 12.0–46.0)
Lymphs Abs: 1.1 10*3/uL (ref 0.7–4.0)
MCHC: 32.8 g/dL (ref 30.0–36.0)
MCV: 86.2 fl (ref 78.0–100.0)
Monocytes Absolute: 0.5 10*3/uL (ref 0.1–1.0)
Monocytes Relative: 8.7 % (ref 3.0–12.0)
Neutro Abs: 3.7 10*3/uL (ref 1.4–7.7)
Neutrophils Relative %: 67.3 % (ref 43.0–77.0)
Platelets: 222 10*3/uL (ref 150.0–400.0)
RBC: 4.85 Mil/uL (ref 4.22–5.81)
RDW: 14 % (ref 11.5–15.5)
WBC: 5.4 10*3/uL (ref 4.0–10.5)

## 2021-09-14 LAB — COMPREHENSIVE METABOLIC PANEL
ALT: 19 U/L (ref 0–53)
AST: 18 U/L (ref 0–37)
Albumin: 4.2 g/dL (ref 3.5–5.2)
Alkaline Phosphatase: 76 U/L (ref 39–117)
BUN: 13 mg/dL (ref 6–23)
CO2: 32 mEq/L (ref 19–32)
Calcium: 9.5 mg/dL (ref 8.4–10.5)
Chloride: 99 mEq/L (ref 96–112)
Creatinine, Ser: 0.57 mg/dL (ref 0.40–1.50)
GFR: 104.85 mL/min (ref >60.00)
Glucose, Bld: 131 mg/dL — ABNORMAL HIGH (ref 70–99)
Potassium: 3.9 mEq/L (ref 3.5–5.1)
Sodium: 139 mEq/L (ref 135–145)
Total Bilirubin: 0.5 mg/dL (ref 0.2–1.2)
Total Protein: 6.6 g/dL (ref 6.0–8.3)

## 2021-09-14 NOTE — Patient Instructions (Signed)
Colace  -  for as needed constipation   Diverticulitis Diverticulitis is when small pouches in your colon (large intestine) get infected or swollen. This causes pain in the belly (abdomen) and watery poop (diarrhea). These pouches are called diverticula. The pouches form in people who have a condition called diverticulosis. What are the causes? This condition may be caused by poop (stool) that gets trapped in the pouches in your colon. The poop lets germs (bacteria) grow in the pouches. This causes the infection. What increases the risk? You are more likely to get this condition if you have small pouches in your colon. The risk is higher if: You are overweight or very overweight (obese). You do not exercise enough. You drink alcohol. You smoke or use products with tobacco in them. You eat a diet that has a lot of red meat such as beef, pork, or lamb. You eat a diet that does not have enough fiber in it. You are older than 63 years of age. What are the signs or symptoms? Pain in the belly. Pain is often on the left side, but it may be in other areas. Fever and feeling cold. Feeling like you may vomit. Vomiting. Having cramps. Feeling full. Changes to how often you poop. Blood in your poop. How is this treated? Most cases are treated at home by: Taking over-the-counter pain medicines. Following a clear liquid diet. Taking antibiotic medicines. Resting. Very bad cases may need to be treated at a hospital. This may include: Not eating or drinking. Taking prescription pain medicine. Getting antibiotic medicines through an IV tube. Getting fluid and food through an IV tube. Having surgery. When you are feeling better, your doctor may tell you to have a test to check your colon (colonoscopy). Follow these instructions at home: Medicines Take over-the-counter and prescription medicines only as told by your doctor. These include: Antibiotics. Pain medicines. Fiber  pills. Probiotics. Stool softeners. If you were prescribed an antibiotic medicine, take it as told by your doctor. Do not stop taking the antibiotic even if you start to feel better. Ask your doctor if the medicine prescribed to you requires you to avoid driving or using machinery. Eating and drinking  Follow a diet as told by your doctor. When you feel better, your doctor may tell you to change your diet. You may need to eat a lot of fiber. Fiber makes it easier to poop (have a bowel movement). Foods with fiber include: Berries. Beans. Lentils. Green vegetables. Avoid eating red meat. General instructions Do not use any products that contain nicotine or tobacco, such as cigarettes, e-cigarettes, and chewing tobacco. If you need help quitting, ask your doctor. Exercise 3 or more times a week. Try to get 30 minutes each time. Exercise enough to sweat and make your heart beat faster. Keep all follow-up visits as told by your doctor. This is important. Contact a doctor if: Your pain does not get better. You are not pooping like normal. Get help right away if: Your pain gets worse. Your symptoms do not get better. Your symptoms get worse very fast. You have a fever. You vomit more than one time. You have poop that is: Bloody. Black. Tarry. Summary This condition happens when small pouches in your colon get infected or swollen. Take medicines only as told by your doctor. Follow a diet as told by your doctor. Keep all follow-up visits as told by your doctor. This is important. This information is not intended to replace advice given  to you by your health care provider. Make sure you discuss any questions you have with your health care provider. Document Revised: 05/25/2019 Document Reviewed: 05/25/2019 Elsevier Patient Education  2022 [employer].

## 2021-09-14 NOTE — Progress Notes (Signed)
Subjective:  Patient ID: Damon Dudley, male    DOB: [birth year]  Age: 61 y.o. MRN: [id number]  CC:  Chief Complaint  Patient presents with   GI Problem      HPI  This patient arrives today for the above.  He has been experiencing left lower quadrant pain for the last 6 days.  He tells me the pain is constant but waxes and wanes.  He also tells me that since around Tuesday of this week his pain has been much improved.  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 He wanted to be evaluated by a provider because he has had similar episodes of left lower quadrant pain in 2010 and in 2014.  Both times the pain eventually subsided on its own, but his wife encouraged him to be evaluated since this has been a recurrent issue.  He denies any fever today but tells me on the first day of symptom onset and the day after he felt a little feverish but did not take his temperature to verify this.  He tried seltzer water and Tylenol as needed for pain which seem to mildly improved symptoms but not much.  He tells me he does have a history of constipation and did have polyps seen on his last colonoscopy and is wondering if he may be due for colonoscopy now (he reports that his last colonoscopy was completed in [place] prior to moving here.).  He denies seeing any blood in his stool.  He continues to have some mild bloating currently.  He also has a history of lymphoma for which he follows with oncology, he is not currently under active treatment.  Past Medical History:  Diagnosis Date   Diabetes mellitus without complication ([place])    Hypertension       Family History  Problem Relation Age of Onset   Diabetes Mother    Diabetes Sister     Social History   Social History Narrative   Not on file    Social History   Tobacco Use   Smoking status: Former    Types: Cigarettes    Quit date: 08/27/2010    Years since quitting: 11.0   Smokeless tobacco: Never  Substance Use Topics   Alcohol use: Not Currently     Current Meds  Medication Sig   atorvastatin (LIPITOR) 40 MG tablet Take 1 tablet (40 mg total) by mouth daily.   enalapril-hydrochlorothiazide (VASERETIC) 10-25 MG tablet Take 1 tablet by mouth daily.   glipiZIDE (GLUCOTROL) 10 MG tablet Take 1 tablet (10 mg total) by mouth daily before breakfast.   insulin degludec (TRESIBA FLEXTOUCH) 100 UNIT/ML FlexTouch Pen Inject 25 Units into the skin at bedtime.   metFORMIN (GLUCOPHAGE) 1000 MG tablet Take 1 tablet (1,000 mg total) by mouth 2 (two) times daily with a meal.   nystatin ointment (MYCOSTATIN) Apply topically 2 (two) times daily.   nystatin-triamcinolone ointment (MYCOLOG) Apply 1 application topically 2 (two) times daily.   ONETOUCH VERIO test strip TEST TWO TIMES A DAY.   tamsulosin (FLOMAX) 0.4 MG CAPS capsule Take 0.4 mg by mouth daily.   TRULICITY 1.5 ST/4.1DQ SOPN Inject into the skin.    ROS:  Review of Systems  Constitutional:  Positive for weight loss (  early satiety).  Respiratory:  Negative for shortness of breath.   Cardiovascular:  Negative for chest pain.  Gastrointestinal:  Positive for abdominal pain (constant, but waxes and wanes "twisting/pinching") and constipation. Negative for blood in stool, diarrhea, melena, nausea and vomiting.    Objective:   Today's Vitals: BP 110/72 (BP Location: Left Arm, Patient Position: Sitting, Cuff Size: Normal)    Pulse 82    Temp 98.5 F (36.9 C) (Oral)    Ht 5\' 4"  (1.626 m)    Wt 187 lb (84.8 kg)    SpO2 96%    BMI 32.10 kg/m  Vitals with BMI 09/14/2021 07/12/2021 04/24/2021  Height 5\' 4"  5\' 4"  5\' 4"   Weight 187 lbs 194 lbs 186 lbs 11 oz  BMI 32.08 82.42 35.36  Systolic 144 315 400  Diastolic 72 80 77  Pulse 82 81 87     Physical Exam Vitals reviewed.   Constitutional:      Appearance: Normal appearance.  HENT:     Head: Normocephalic and atraumatic.  Cardiovascular:     Rate and Rhythm: Normal rate and regular rhythm.  Pulmonary:     Effort: Pulmonary effort is normal.     Breath sounds: Normal breath sounds.  Abdominal:     General: Abdomen is flat. Bowel sounds are normal.     Palpations: Abdomen is soft. There is no hepatomegaly, splenomegaly or mass.     Tenderness: There is abdominal tenderness (mild pain to deep palpation) in the left lower quadrant.     Hernia: No hernia is present.  Musculoskeletal:     Cervical back: Neck supple.  Skin:    General: Skin is warm and dry.  Neurological:     Mental Status: He is alert and oriented to person, place, and time.  Psychiatric:        Mood and Affect: Mood normal.        Behavior: Behavior normal.        Thought Content: Thought content normal.        Judgment: Judgment normal.         Assessment and Plan   1. Left lower quadrant abdominal pain   2. Colon cancer screening      Plan: 1. , 2. Most likely etiology I believe is diverticulitis.  It appears that this is improving slowly on its own.  For now I do not recommend that he take antibiotics unless symptoms worsen.  We will also check CBC and metabolic panel to ensure white blood cell count is not too elevated.  Because he has a history of colon polyps and lymphoma I will order CT scan for further evaluation of his pain, and will refer patient to gastroenterology for further assistance in managing his symptoms as well as to undergo colon cancer screening for which she feels he is due for.   Tests ordered Orders Placed This Encounter  Procedures   CT Abdomen Pelvis Wo Contrast   CBC with Differential/Platelet   Comprehensive metabolic panel   Ambulatory referral to Gastroenterology      No orders of the defined types were placed in this encounter.   Patient to follow-up with primary care provider (Dr.  Mitchel Honour) as scheduled or sooner as needed.  Ailene Ards, NP

## 2021-09-18 ENCOUNTER — Other Ambulatory Visit: Payer: Self-pay | Admitting: Emergency Medicine

## 2021-09-18 DIAGNOSIS — E1165 Type 2 diabetes mellitus with hyperglycemia: Secondary | ICD-10-CM

## 2021-09-25 ENCOUNTER — Other Ambulatory Visit: Payer: Self-pay | Admitting: Emergency Medicine

## 2021-09-25 DIAGNOSIS — E1165 Type 2 diabetes mellitus with hyperglycemia: Secondary | ICD-10-CM

## 2021-09-26 ENCOUNTER — Ambulatory Visit
Admission: RE | Admit: 2021-09-26 | Discharge: 2021-09-26 | Disposition: A | Discharge status: Home / Self Care | Payer: Medicare HMO | Source: Ambulatory Visit | Attending: Nurse Practitioner | Admitting: Nurse Practitioner

## 2021-09-26 DIAGNOSIS — R1032 Left lower quadrant pain: Secondary | ICD-10-CM

## 2021-09-27 ENCOUNTER — Telehealth: Payer: Self-pay

## 2021-09-27 NOTE — Telephone Encounter (Signed)
This message was routed to me.  I am looking at this message at 11:29 PM on 09/27/2021. I will be out of town tomorrow.  Please have the patient see any provider in the office on 09-28-21 to discuss further plans.  If he is in pain, he will need to be sent to ER.  Thanks,

## 2021-09-27 NOTE — Telephone Encounter (Signed)
°  Tip appendicitis  Radiology called to report a critical.

## 2021-09-28 ENCOUNTER — Encounter (HOSPITAL_COMMUNITY): Payer: Self-pay

## 2021-09-28 ENCOUNTER — Emergency Department (HOSPITAL_COMMUNITY)
Admission: EM | Admit: 2021-09-28 | Discharge: 2021-09-28 | Disposition: A | Discharge status: Home / Self Care | Payer: Medicare HMO | Attending: Emergency Medicine | Admitting: Emergency Medicine

## 2021-09-28 DIAGNOSIS — Z7984 Long term (current) use of oral hypoglycemic drugs: Secondary | ICD-10-CM | POA: Insufficient documentation

## 2021-09-28 DIAGNOSIS — Z794 Long term (current) use of insulin: Secondary | ICD-10-CM | POA: Insufficient documentation

## 2021-09-28 DIAGNOSIS — Z79899 Other long term (current) drug therapy: Secondary | ICD-10-CM | POA: Insufficient documentation

## 2021-09-28 DIAGNOSIS — E119 Type 2 diabetes mellitus without complications: Secondary | ICD-10-CM | POA: Insufficient documentation

## 2021-09-28 DIAGNOSIS — R1032 Left lower quadrant pain: Secondary | ICD-10-CM | POA: Diagnosis present

## 2021-09-28 DIAGNOSIS — I10 Essential (primary) hypertension: Secondary | ICD-10-CM | POA: Insufficient documentation

## 2021-09-28 DIAGNOSIS — K3580 Unspecified acute appendicitis: Secondary | ICD-10-CM | POA: Insufficient documentation

## 2021-09-28 LAB — COMPREHENSIVE METABOLIC PANEL
ALT: 25 U/L (ref 0–44)
AST: 25 U/L (ref 15–41)
Albumin: 4.3 g/dL (ref 3.5–5.0)
Alkaline Phosphatase: 65 U/L (ref 38–126)
Anion gap: 9 (ref 5–15)
BUN: 10 mg/dL (ref 8–23)
CO2: 27 mmol/L (ref 22–32)
Calcium: 9.5 mg/dL (ref 8.9–10.3)
Chloride: 103 mmol/L (ref 98–111)
Creatinine, Ser: 0.5 mg/dL — ABNORMAL LOW (ref 0.61–1.24)
GFR, Estimated: >60 mL/min (ref >60)
Glucose, Bld: 95 mg/dL (ref 70–99)
Potassium: 3.9 mmol/L (ref 3.5–5.1)
Sodium: 139 mmol/L (ref 135–145)
Total Bilirubin: 0.2 mg/dL — ABNORMAL LOW (ref 0.3–1.2)
Total Protein: 6.6 g/dL (ref 6.5–8.1)

## 2021-09-28 LAB — CBC WITH DIFFERENTIAL/PLATELET
Abs Immature Granulocytes: 0.02 10*3/uL (ref 0.00–0.07)
Basophils Absolute: 0.1 10*3/uL (ref 0.0–0.1)
Basophils Relative: 1 %
Eosinophils Absolute: 0.1 10*3/uL (ref 0.0–0.5)
Eosinophils Relative: 2 %
HCT: 41.7 % (ref 39.0–52.0)
Hemoglobin: 13.8 g/dL (ref 13.0–17.0)
Immature Granulocytes: 0 %
Lymphocytes Relative: 26 %
Lymphs Abs: 1.4 10*3/uL (ref 0.7–4.0)
MCH: 28.5 pg (ref 26.0–34.0)
MCHC: 33.1 g/dL (ref 30.0–36.0)
MCV: 86.2 fL (ref 80.0–100.0)
Monocytes Absolute: 0.4 10*3/uL (ref 0.1–1.0)
Monocytes Relative: 7 %
Neutro Abs: 3.4 10*3/uL (ref 1.7–7.7)
Neutrophils Relative %: 64 %
Platelets: 209 10*3/uL (ref 150–400)
RBC: 4.84 MIL/uL (ref 4.22–5.81)
RDW: 12.8 % (ref 11.5–15.5)
WBC: 5.4 10*3/uL (ref 4.0–10.5)
nRBC: 0 % (ref 0.0–0.2)

## 2021-09-28 LAB — LIPASE, BLOOD: Lipase: 36 U/L (ref 11–51)

## 2021-09-28 MED ORDER — AMOXICILLIN-POT CLAVULANATE 875-125 MG PO TABS: 1.0000 | ORAL_TABLET | Freq: Two times a day (BID) | ORAL | 0 refills | Status: DC

## 2021-09-28 MED ORDER — DICYCLOMINE HCL 20 MG PO TABS: 20.0000 mg | ORAL_TABLET | Freq: Two times a day (BID) | ORAL | 0 refills | Status: AC

## 2021-09-28 MED ORDER — ONDANSETRON 4 MG PO TBDP: 4.0000 mg | ORAL_TABLET | Freq: Three times a day (TID) | ORAL | 0 refills | Status: DC | PRN

## 2021-09-28 MED ORDER — AMOXICILLIN-POT CLAVULANATE 875-125 MG PO TABS: 1.0000 | ORAL_TABLET | Freq: Two times a day (BID) | ORAL | 0 refills | Status: AC

## 2021-09-28 NOTE — Discharge Instructions (Addendum)
Please follow-up with your gastroenterologist Please take the antibiotics as prescribed you for the entire course.  I have prescribed you Zofran for nausea Bentyl for pain  You may always return to the emergency room if any new or concerning symptoms.

## 2021-09-28 NOTE — ED Provider Triage Note (Signed)
Emergency Medicine Provider Triage Evaluation Note  Damon Dudley , a 61 y.o. male  was evaluated in triage.  Pt complains of 3 weeks of abd pain states was worse initially but now improving. Had CT scan done two days ago that showed appendicitis and was told to come to ER.   No fever, n, v , d   Review of Systems  Positive: Abd pain Negative: Fever   Physical Exam  BP (!) 143/88    Pulse 79    Temp 97.9 F (36.6 C)    Resp 16    Ht 5\' 4"  (1.626 m)    Wt 84.4 kg    SpO2 97%    BMI 31.93 kg/m  Gen:   Awake, no distress   Resp:  Normal effort  MSK:   Moves extremities without difficulty Other:  TTP in LLQ   Medical Decision Making  Medically screening exam initiated at 12:08 PM.  Appropriate orders placed.  Damon Dudley was informed that the remainder of the evaluation will be completed by another provider, this initial triage assessment does not replace that evaluation, and the importance of remaining in the ED until their evaluation is complete.  CT scan reviewed. Will obtain labs.    Damon Dudley, [place] 09/28/21 1210

## 2021-09-28 NOTE — Telephone Encounter (Signed)
Called and spoke with pt, he states he went to ER today. Made pt a hospital f/u appt.

## 2021-09-28 NOTE — Telephone Encounter (Signed)
Thank you :)
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
## 2021-09-28 NOTE — ED Notes (Signed)
Surgery consult bedside.

## 2021-09-28 NOTE — ED Notes (Signed)
An After Visit Summary was printed and given to the patient. Discharge instructions given and no further questions at this time.  

## 2021-09-28 NOTE — ED Triage Notes (Signed)
Pt arrived via POV, c/o abd pain. States he was seen and had CT done, told possible appendicitis and told to go to ED

## 2021-09-28 NOTE — Telephone Encounter (Signed)
Agree with recommendation to go to the emergency department.  Thanks.

## 2021-09-28 NOTE — Telephone Encounter (Signed)
Pt calling checking status of return call, pt inquiring if he should wait on provider's call, informed pt of previous message stating he should go to the ER immediately   Pt states due to a previous experience w/ a friend's lengthy wait time he will wait on the provider recommendations, pt inquiring if provider can get him assessed sooner at the ER  Pt requesting a c/b

## 2021-09-28 NOTE — ED Provider Notes (Signed)
[facility] DEPT Provider Note   CSN: [id number] Arrival date & time: 09/28/21  1152     History  Chief Complaint  Patient presents with   Abdominal Pain    Damon Dudley is a 61 y.o. male.   Abdominal Pain  Patient is a 61 year old gentleman with past medical history significant for diabetes 2 and HLD and HTN  Is presented today to the emergency room with 3 days of ongoing abdominal pain seems to be a left lower abdomen and seems to be achy       Home Medications Prior to Admission medications   Medication Sig Start Date End Date Taking? Authorizing Provider  dicyclomine (BENTYL) 20 MG tablet Take 1 tablet (20 mg total) by mouth 2 (two) times daily. 09/28/21  Yes Dyana Magner S, PA  ondansetron (ZOFRAN-ODT) 4 MG disintegrating tablet Take 1 tablet (4 mg total) by mouth every 8 (eight) hours as needed for nausea or vomiting. 09/28/21  Yes Mohd Clemons S, PA  amoxicillin-clavulanate (AUGMENTIN) 875-125 MG tablet Take 1 tablet by mouth every 12 (twelve) hours for 14 days. 09/28/21 10/12/21  Tedd Sias, PA  atorvastatin (LIPITOR) 40 MG tablet Take 1 tablet (40 mg total) by mouth daily. 09/21/20   Horald Pollen, MD  enalapril-hydrochlorothiazide (VASERETIC) 10-25 MG tablet Take 1 tablet by mouth daily. 09/21/20   Horald Pollen, MD  glipiZIDE (GLUCOTROL) 10 MG tablet TAKE 1 TABLET DAILY BEFORE BREAKFAST 09/25/21   Horald Pollen, MD  metFORMIN (GLUCOPHAGE) 1000 MG tablet TAKE 1 TABLET TWICE DAILY  WITH MEALS 09/18/21   Sagardia, Ines Bloomer, MD  nystatin ointment (MYCOSTATIN) Apply topically 2 (two) times daily. 06/08/21   [provider]  nystatin-triamcinolone ointment (MYCOLOG) Apply 1 application topically 2 (two) times daily. 06/08/21   Landis Martins, DPM  ONETOUCH VERIO test strip TEST TWO TIMES A DAY. 12/28/20   Horald Pollen, MD  tamsulosin (FLOMAX) 0.4 MG CAPS capsule Take 0.4 mg by mouth daily. 07/29/21    [provider]  TRESIBA FLEXTOUCH 100 UNIT/ML FlexTouch Pen INJECT 25 UNITS            SUBCUTANEOUSLY AT BEDTIME 09/18/21   Horald Pollen, MD  TRULICITY 1.5 XB/9.3JQ SOPN Inject into the skin. 06/09/21   [provider]      Allergies    Patient has no known allergies.    Review of Systems   Review of Systems  Gastrointestinal:  Positive for abdominal pain.   Physical Exam Updated Vital Signs BP (!) 143/88    Pulse 79    Temp 97.9 F (36.6 C)    Resp 16    Ht 5\' 4"  (1.626 m)    Wt 84.4 kg    SpO2 97%    BMI 31.93 kg/m  Physical Exam Vitals and nursing note reviewed.  Constitutional:      General: He is not in acute distress. HENT:     Head: Normocephalic and atraumatic.     Nose: Nose normal.  Eyes:     General: No scleral icterus. Cardiovascular:     Rate and Rhythm: Normal rate and regular rhythm.     Pulses: Normal pulses.     Heart sounds: Normal heart sounds.  Pulmonary:     Effort: Pulmonary effort is normal. No respiratory distress.     Breath sounds: No wheezing.  Abdominal:     Palpations: Abdomen is soft.     Tenderness: There is abdominal tenderness in  the left lower quadrant.  Musculoskeletal:     Cervical back: Normal range of motion.     Right lower leg: No edema.     Left lower leg: No edema.  Skin:    General: Skin is warm and dry.     Capillary Refill: Capillary refill takes less than 2 seconds.  Neurological:     Mental Status: He is alert. Mental status is at baseline.  Psychiatric:        Mood and Affect: Mood normal.        Behavior: Behavior normal.    ED Results / Procedures / Treatments   Labs (all labs ordered are listed, but only abnormal results are displayed) Labs Reviewed  COMPREHENSIVE METABOLIC PANEL - Abnormal; Notable for the following components:      Result Value   Creatinine, Ser 0.50 (*)    Total Bilirubin 0.2 (*)    All other components within normal limits  LIPASE, BLOOD  CBC WITH  DIFFERENTIAL/PLATELET  URINALYSIS, ROUTINE W REFLEX MICROSCOPIC    EKG None  Radiology No results found.  Procedures Procedures    Medications Ordered in ED Medications - No data to display  ED Course/ Medical Decision Making/ A&P Clinical Course as of 09/28/21 1356  Thu Sep 28, 2021  1255 Discussed w gen surg PA Legrand Como  [WF]    Clinical Course User Index [WF] Tedd Sias, PA                           Medical Decision Making Amount and/or Complexity of Data Reviewed Labs: ordered.  Risk Prescription drug management.   Patient is a 61 year old male presented emergency room today with 3 weeks of left lower quadrant abdominal pain symptoms seem to have been severe at first but improved but has been consistent.  6/10 pain.  No nausea vomiting fevers  He had a CT scan done as an outpatient which showed concern for tip appendicitis.  On my examination patient has left lower quadrant abdominal tenderness is not guarding.  Discussed with general surgery Legrand Como PA who will evaluate.  Lipase within normal limits Depakote is CBC unremarkable CMP unremarkable I personally viewed all labs and CT scan from prior patient outpatient work-up.  General surgery evaluated patient at bedside appreciate their expert consultation they recommend Augmentin twice daily for 2 weeks and follow-up with gastroenterology.  We will discharge patient home at this time.  I have provided patient with very strict return cautions which she is understanding of all questions answered to the best of my ability.  I provided patient with Zofran today as well as any nausea and Bentyl.  Abdominal pain also recommended Tylenol 1000 mg every 6 hours.  Final Clinical Impression(s) / ED Diagnoses Final diagnoses:  Acute appendicitis, unspecified acute appendicitis type    Rx / DC Orders ED Discharge Orders          Ordered    amoxicillin-clavulanate (AUGMENTIN) 875-125 MG tablet  Every 12 hours,    Status:  Discontinued        09/28/21 1323    dicyclomine (BENTYL) 20 MG tablet  2 times daily        09/28/21 1323    ondansetron (ZOFRAN-ODT) 4 MG disintegrating tablet  Every 8 hours PRN        09/28/21 1323    amoxicillin-clavulanate (AUGMENTIN) 875-125 MG tablet  Every 12 hours        09/28/21 1333  Tedd Sias, [place] 09/28/21 2049    Lennice Sites, DO 09/29/21 [phone]

## 2021-10-02 ENCOUNTER — Encounter: Payer: Self-pay | Admitting: Physician Assistant

## 2021-10-03 ENCOUNTER — Ambulatory Visit (INDEPENDENT_AMBULATORY_CARE_PROVIDER_SITE_OTHER): Payer: Medicare HMO | Admitting: Emergency Medicine

## 2021-10-03 ENCOUNTER — Encounter: Payer: Self-pay | Admitting: Emergency Medicine

## 2021-10-03 ENCOUNTER — Other Ambulatory Visit: Payer: Self-pay

## 2021-10-03 VITALS — BP 120/76 | HR 75 | Temp 98.3°F | Ht 64.0 in | Wt 184.5 lb

## 2021-10-03 DIAGNOSIS — R1030 Lower abdominal pain, unspecified: Secondary | ICD-10-CM
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
## 2021-10-03 NOTE — Assessment & Plan Note (Signed)
Asymptomatic.  100% better.  Benign examination. Clinically stable.  No medical concerns identified during this visit.

## 2021-10-03 NOTE — Patient Instructions (Signed)
Dolor abdominal en los adultos Abdominal Pain, Adult El dolor de estmago (abdominal) puede tener muchas causas. [place] mayora de las veces, el dolor de estmago no es peligroso. Muchos de estos casos de dolor de estmago pueden controlarse y tratarse en casa. Sin embargo, a veces, el dolor de estmago es grave. Elmdico intentar descubrir la causa del dolor de estmago. Siga estas instrucciones en su casa:  Medicamentos [place] los medicamentos de venta libre y los recetados solamente como se lo haya indicado el mdico. No tome medicamentos que lo ayuden a defecar (laxantes), salvo que el mdico se lo indique. Instrucciones generales Est atento al dolor de estmago para detectar cualquier cambio. Beba suficiente lquido para mantener el pis (la orina) de color amarillo plido. Concurra a todas las visitas de seguimiento como se lo haya indicado el mdico. Esto es importante. Comunquese con un mdico si: El dolor de estmago cambia o empeora. No tiene apetito o baja de peso sin proponrselo. Tiene dificultades para defecar (est estreido) o heces lquidas (diarrea) durante ms de 2 o 3 das. Siente dolor al orinar o defecar. El dolor de estmago lo despierta de noche. El dolor empeora con las comidas, despus de comer o con determinados alimentos. Tiene vmitos y no puede retener nada de lo que ingiere. Tiene fiebre. Observa sangre en la orina. Solicite ayuda de inmediato si: El dolor no desaparece en el tiempo indicado por el mdico. No puede dejar de vomitar. Siente dolor solamente en zonas especficas del abdomen, como el lado derecho o la parte inferior izquierda. Tiene heces con sangre, de color negro o con aspecto alquitranado. Tiene dolor muy intenso en el vientre, clicos o meteorismo. Presenta signos de no tener suficientes lquidos o agua en el cuerpo (deshidratacin), por ejemplo: Orina oscura, muy escasa o falta de orina. Labios agrietados. Sequedad de boca. Ojos  hundidos. Somnolencia. Debilidad. Tiene dificultad para respirar o dolor en el pecho. Resumen Muchos de estos casos de dolor de estmago pueden controlarse y tratarse en casa. Est atento al dolor de estmago para detectar cualquier cambio. Tome los medicamentos de venta libre y los recetados solamente como se lo haya indicado el mdico. Comunquese con un mdico si el dolor de estmago cambia o empeora. Busque ayuda de inmediato si tiene dolor muy intenso en el vientre, clicos o meteorismo. Esta informacin no tiene como fin reemplazar el consejo del mdico. Asegresede hacerle al mdico cualquier pregunta que tenga. Document Revised: 02/18/2019 Document Reviewed: 02/18/2019 Elsevier Patient Education  2022 Elsevier Inc.  

## 2021-10-12 ENCOUNTER — Ambulatory Visit: Payer: Medicare HMO | Admitting: Physician Assistant

## 2021-10-18 ENCOUNTER — Ambulatory Visit: Payer: Medicare HMO | Admitting: Physician Assistant

## 2021-10-23 ENCOUNTER — Inpatient Hospital Stay: Payer: Medicare HMO | Attending: Hematology | Admitting: Hematology

## 2021-10-23 ENCOUNTER — Other Ambulatory Visit: Payer: Self-pay

## 2021-10-23 ENCOUNTER — Inpatient Hospital Stay: Payer: Medicare HMO

## 2021-10-23 VITALS — BP 123/80 | HR 76 | Temp 97.9°F | Resp 17 | Ht 64.0 in | Wt 186.6 lb

## 2021-10-23 DIAGNOSIS — D696 Thrombocytopenia, unspecified: Secondary | ICD-10-CM

## 2021-10-23 DIAGNOSIS — C8581 Other specified types of non-Hodgkin lymphoma, lymph nodes of head, face, and neck: Secondary | ICD-10-CM

## 2021-10-23 DIAGNOSIS — Z87891 Personal history of nicotine dependence: Secondary | ICD-10-CM | POA: Insufficient documentation

## 2021-10-23 DIAGNOSIS — Z8572 Personal history of non-Hodgkin lymphomas: Secondary | ICD-10-CM | POA: Insufficient documentation

## 2021-10-23 LAB — CBC WITH DIFFERENTIAL/PLATELET
Abs Immature Granulocytes: 0.01 10*3/uL (ref 0.00–0.07)
Basophils Absolute: 0 10*3/uL (ref 0.0–0.1)
Basophils Relative: 1 %
Eosinophils Absolute: 0.2 10*3/uL (ref 0.0–0.5)
Eosinophils Relative: 3 %
HCT: 40.8 % (ref 39.0–52.0)
Hemoglobin: 14 g/dL (ref 13.0–17.0)
Immature Granulocytes: 0 %
Lymphocytes Relative: 23 %
Lymphs Abs: 1.2 10*3/uL (ref 0.7–4.0)
MCH: 29 pg (ref 26.0–34.0)
MCHC: 34.3 g/dL (ref 30.0–36.0)
MCV: 84.5 fL (ref 80.0–100.0)
Monocytes Absolute: 0.5 10*3/uL (ref 0.1–1.0)
Monocytes Relative: 9 %
Neutro Abs: 3.4 10*3/uL (ref 1.7–7.7)
Neutrophils Relative %: 64 %
Platelets: 147 10*3/uL — ABNORMAL LOW (ref 150–400)
RBC: 4.83 MIL/uL (ref 4.22–5.81)
RDW: 13.3 % (ref 11.5–15.5)
WBC: 5.3 10*3/uL (ref 4.0–10.5)
nRBC: 0 % (ref 0.0–0.2)

## 2021-10-23 LAB — CMP (CANCER CENTER ONLY)
ALT: 23 U/L (ref 0–44)
AST: 21 U/L (ref 15–41)
Albumin: 4.4 g/dL (ref 3.5–5.0)
Alkaline Phosphatase: 75 U/L (ref 38–126)
Anion gap: 8 (ref 5–15)
BUN: 10 mg/dL (ref 8–23)
CO2: 28 mmol/L (ref 22–32)
Calcium: 9.6 mg/dL (ref 8.9–10.3)
Chloride: 102 mmol/L (ref 98–111)
Creatinine: 0.58 mg/dL — ABNORMAL LOW (ref 0.61–1.24)
GFR, Estimated: >60 mL/min (ref >60)
Glucose, Bld: 122 mg/dL — ABNORMAL HIGH (ref 70–99)
Potassium: 3.9 mmol/L (ref 3.5–5.1)
Sodium: 138 mmol/L (ref 135–145)
Total Bilirubin: 0.4 mg/dL (ref 0.3–1.2)
Total Protein: 6.6 g/dL (ref 6.5–8.1)

## 2021-10-23 LAB — LACTATE DEHYDROGENASE: LDH: 127 U/L (ref 98–192)

## 2021-10-25 ENCOUNTER — Telehealth: Payer: Self-pay | Admitting: Hematology

## 2021-10-25 NOTE — Telephone Encounter (Signed)
Scheduled follow-up appointment per 2/27 los. Patient is aware. ?
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
## 2021-10-27 ENCOUNTER — Encounter: Payer: Self-pay | Admitting: Physician Assistant

## 2021-10-27 ENCOUNTER — Ambulatory Visit: Payer: Medicare HMO | Admitting: Physician Assistant

## 2021-10-27 VITALS — BP 130/80 | HR 84 | Ht 63.0 in | Wt 187.2 lb

## 2021-10-27 DIAGNOSIS — R1032 Left lower quadrant pain: Secondary | ICD-10-CM

## 2021-10-27 DIAGNOSIS — Z86010 Personal history of colonic polyps: Secondary | ICD-10-CM

## 2021-10-27 NOTE — Patient Instructions (Signed)
If you are age 63 or older, your body mass index should be between 23-30. Your Body mass index is 33.17 kg/m?Marland Kitchen If this is out of the aforementioned range listed, please consider follow up with your Primary Care Provider. ? ?If you are age 58 or younger, your body mass index should be between 19-25. Your Body mass index is 33.17 kg/m?Marland Kitchen If this is out of the aformentioned range listed, please consider follow up with your Primary Care Provider.  ? ?________________________________________________________ ? ?The [place] [facility] providers would like to encourage you to use [facility] to communicate with providers for non-urgent requests or questions.  Due to long hold times on the telephone, sending your provider a message by [facility] may be a faster and more efficient way to get a response.  Please allow 48 business hours for a response.  Please remember that this is for non-urgent requests.  ?_______________________________________________________ ? ?You have been scheduled for a colonoscopy. Please follow written instructions given to you at your visit today.  ?Please pick up your prep supplies at the pharmacy within the next 1-3 days. ?If you use inhalers (even only as needed), please bring them with you on the day of your procedure. ? ?
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
## 2021-11-01 ENCOUNTER — Other Ambulatory Visit: Payer: Self-pay | Admitting: Emergency Medicine

## 2021-11-01 DIAGNOSIS — E1159 Type 2 diabetes mellitus with other circulatory complications: Secondary | ICD-10-CM

## 2021-11-01 DIAGNOSIS — I10 Essential (primary) hypertension: Secondary | ICD-10-CM

## 2021-11-01 DIAGNOSIS — I152 Hypertension secondary to endocrine disorders: Secondary | ICD-10-CM

## 2021-11-21 NOTE — Progress Notes (Signed)
Reviewed and agree with documentation and assessment and plan. K. Veena Laryn Venning , MD   

## 2021-11-24 ENCOUNTER — Other Ambulatory Visit: Payer: Self-pay | Admitting: Emergency Medicine

## 2021-11-24 DIAGNOSIS — E1165 Type 2 diabetes mellitus with hyperglycemia: Secondary | ICD-10-CM

## 2021-12-01 LAB — HM DIABETES EYE EXAM

## 2021-12-13 ENCOUNTER — Encounter: Payer: Medicare HMO | Admitting: Gastroenterology

## 2022-01-11 ENCOUNTER — Ambulatory Visit (INDEPENDENT_AMBULATORY_CARE_PROVIDER_SITE_OTHER): Payer: Medicare HMO | Admitting: Emergency Medicine

## 2022-01-11 ENCOUNTER — Encounter: Payer: Self-pay | Admitting: Emergency Medicine

## 2022-01-11 VITALS — BP 122/84 | HR 79 | Temp 98.0°F | Ht 63.0 in | Wt 186.0 lb

## 2022-01-11 DIAGNOSIS — N138 Other obstructive and reflux uropathy: Secondary | ICD-10-CM

## 2022-01-11 DIAGNOSIS — N401 Enlarged prostate with lower urinary tract symptoms: Secondary | ICD-10-CM

## 2022-01-11 DIAGNOSIS — I152 Hypertension secondary to endocrine disorders: Secondary | ICD-10-CM

## 2022-01-11 DIAGNOSIS — C8591 Non-Hodgkin lymphoma, unspecified, lymph nodes of head, face, and neck: Secondary | ICD-10-CM

## 2022-01-11 DIAGNOSIS — E1159 Type 2 diabetes mellitus with other circulatory complications: Secondary | ICD-10-CM

## 2022-01-11 DIAGNOSIS — E1165 Type 2 diabetes mellitus with hyperglycemia: Secondary | ICD-10-CM

## 2022-01-11 LAB — POCT GLYCOSYLATED HEMOGLOBIN (HGB A1C): Hemoglobin A1C: 6.4 % — AB (ref 4.0–5.6)

## 2022-01-11 MED ORDER — TAMSULOSIN HCL 0.4 MG PO CAPS: 0.4000 mg | ORAL_CAPSULE | Freq: Every day | ORAL | 3 refills | Status: AC

## 2022-01-11 NOTE — Patient Instructions (Signed)
Mantenimiento de la salud en los hombres Health Maintenance, Male Adoptar un estilo de vida saludable y recibir atencin preventiva son importantes para promover la salud y el bienestar. Consulte al mdico sobre: El esquema adecuado para hacerse pruebas y exmenes peridicos. Cosas que puede hacer por su cuenta para prevenir enfermedades y mantenerse sano. Qu debo saber sobre la dieta, el peso y el ejercicio? Consuma una dieta saludable  Consuma una dieta que incluya muchas verduras, frutas, productos lcteos con bajo contenido de grasa y protenas magras. No consuma muchos alimentos ricos en grasas slidas, azcares agregados o sodio. Mantenga un peso saludable El ndice de masa muscular (IMC) es una medida que puede utilizarse para identificar posibles problemas de peso. Proporciona una estimacin de la grasa corporal basndose en el peso y la altura. Su mdico puede ayudarle a determinar su IMC y a lograr o mantener un peso saludable. Haga ejercicio con regularidad Haga ejercicio con regularidad. Esta es una de las prcticas ms importantes que puede hacer por su salud. La mayora de los adultos deben seguir estas pautas: Realizar, al menos, 150 minutos de actividad fsica por semana. El ejercicio debe aumentar la frecuencia cardaca y hacerlo transpirar (ejercicio de intensidad moderada). Hacer ejercicios de fortalecimiento por lo menos dos veces por semana. Agregue esto a su plan de ejercicio de intensidad moderada. Pase menos tiempo sentado. Incluso la actividad fsica ligera puede ser beneficiosa. Controle sus niveles de colesterol y lpidos en la sangre Comience a realizarse anlisis de lpidos y colesterol en la sangre a los 20 aos y luego reptalos cada 5 aos. Es posible que necesite controlar los niveles de colesterol con mayor frecuencia si: Sus niveles de lpidos y colesterol son altos. Es mayor de 40 aos. Presenta un alto riesgo de padecer enfermedades cardacas. Qu debo  saber sobre las pruebas de deteccin del cncer? Muchos tipos de cncer pueden detectarse de manera temprana y, a menudo, pueden prevenirse. Segn su historia clnica y sus antecedentes familiares, es posible que deba realizarse pruebas de deteccin del cncer en diferentes edades. Esto puede incluir pruebas de deteccin de lo siguiente: Cncer colorrectal. Cncer de prstata. Cncer de piel. Cncer de pulmn. Qu debo saber sobre la enfermedad cardaca, la diabetes y la hipertensin arterial? Presin arterial y enfermedad cardaca La hipertensin arterial causa enfermedades cardacas y aumenta el riesgo de accidente cerebrovascular. Es ms probable que esto se manifieste en las personas que tienen lecturas de presin arterial alta o tienen sobrepeso. Hable con el mdico sobre sus valores de presin arterial deseados. Hgase controlar la presin arterial: Cada 3 a 5 aos si tiene entre 18 y 39 aos. Todos los aos si es mayor de 40 aos. Si tiene entre 65 y 75 aos y es fumador o sola fumar, pregntele al mdico si debe realizarse una prueba de deteccin de aneurisma artico abdominal (AAA) por nica vez. Diabetes Realcese exmenes de deteccin de la diabetes con regularidad. Este anlisis revisa el nivel de azcar en la sangre en ayunas. Hgase las pruebas de deteccin: Cada tres aos despus de los 45 aos de edad si tiene un peso normal y un bajo riesgo de padecer diabetes. Con ms frecuencia y a partir de una edad inferior si tiene sobrepeso o un alto riesgo de padecer diabetes. Qu debo saber sobre la prevencin de infecciones? Hepatitis B Si tiene un riesgo ms alto de contraer hepatitis B, debe someterse a un examen de deteccin de este virus. Hable con el mdico para averiguar si tiene riesgo de   contraer la infeccin por hepatitis B. Hepatitis C Se recomienda un anlisis de sangre para: Todos los que nacieron entre 1945 y 1965. Todas las personas que tengan un riesgo de haber  contrado hepatitis C. Enfermedades de transmisin sexual (ETS) Debe realizarse pruebas de deteccin de ITS todos los aos, incluidas la gonorrea y la clamidia, si: Es sexualmente activo y es menor de 24 aos. Es mayor de 24 aos, y el mdico le informa que corre riesgo de tener este tipo de infecciones. La actividad sexual ha cambiado desde que le hicieron la ltima prueba de deteccin y tiene un riesgo mayor de tener clamidia o gonorrea. Pregntele al mdico si usted tiene riesgo. Pregntele al mdico si usted tiene un alto riesgo de contraer VIH. El mdico tambin puede recomendarle un medicamento recetado para ayudar a evitar la infeccin por el VIH. Si elige tomar medicamentos para prevenir el VIH, primero debe hacerse los anlisis de deteccin del VIH. Luego debe hacerse anlisis cada 3 meses mientras est tomando los medicamentos. Siga estas indicaciones en su casa: Consumo de alcohol No beba alcohol si el mdico se lo prohbe. Si bebe alcohol: Limite la cantidad que consume de 0 a 2 bebidas por da. Sepa cunta cantidad de alcohol hay en las bebidas que toma. En los [place], una medida equivale a una botella de cerveza de 12 oz (355 ml), un vaso de vino de 5 oz (148 ml) o un vaso de una bebida alcohlica de alta graduacin de 1 oz (44 ml). Estilo de vida No consuma ningn producto que contenga nicotina o tabaco. Estos productos incluyen cigarrillos, tabaco para mascar y aparatos de vapeo, como los cigarrillos electrnicos. Si necesita ayuda para dejar de consumir estos productos, consulte al mdico. No consuma drogas. No comparta agujas. Solicite ayuda a su mdico si necesita apoyo o informacin para abandonar las drogas. Indicaciones generales Realcese los estudios de rutina de la salud, dentales y de la vista. Mantngase al da con las vacunas. Infrmele a su mdico si: Se siente deprimido con frecuencia. Alguna vez ha sido vctima de maltrato o no se siente seguro en su  casa. Resumen Adoptar un estilo de vida saludable y recibir atencin preventiva son importantes para promover la salud y el bienestar. Siga las instrucciones del mdico acerca de una dieta saludable, el ejercicio y la realizacin de pruebas o exmenes para detectar enfermedades. Siga las instrucciones del mdico con respecto al control del colesterol y la presin arterial. Esta informacin no tiene como fin reemplazar el consejo del mdico. Asegrese de hacerle al mdico cualquier pregunta que tenga. Document Revised: 01/18/2021 Document Reviewed: 01/18/2021 Elsevier Patient Education  2023 [employer]  
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
## 2022-01-11 NOTE — Assessment & Plan Note (Addendum)
Well-controlled hypertension. Continue enalapril-HCTZ 10--25 mg daily. Well-controlled diabetes with hemoglobin A1c of 6.4. Continue weekly Trulicity 1.5 mg, Tresiba 30 units daily, metformin 1000 mg twice a day. Diet and nutrition discussed. Cardiovascular risks associated with hypertension and diabetes discussed. Continue atorvastatin 40 mg daily. The 10-year ASCVD risk score (Arnett DK, et al., 2019) is: 15.6%   Values used to calculate the score:     Age: 61 years     Sex: Male     Is Non-Hispanic African American: No     Diabetic: Yes     Tobacco smoker: No     Systolic Blood Pressure: 914 mmHg     Is BP treated: Yes     HDL Cholesterol: 52 mg/dL     Total Cholesterol: 134 mg/dL  Follow-up in 6 months.

## 2022-01-11 NOTE — Assessment & Plan Note (Signed)
Stable without concerns.  Sees oncologist on a regular basis.

## 2022-01-29 ENCOUNTER — Encounter: Payer: Self-pay | Admitting: Gastroenterology

## 2022-02-05 ENCOUNTER — Encounter: Payer: Self-pay | Admitting: Gastroenterology

## 2022-02-05 ENCOUNTER — Ambulatory Visit (AMBULATORY_SURGERY_CENTER): Payer: Medicare HMO | Admitting: Gastroenterology

## 2022-02-05 VITALS — BP 116/69 | HR 69 | Temp 97.1°F | Resp 13 | Ht 63.0 in | Wt 187.0 lb

## 2022-02-05 DIAGNOSIS — Z09 Encounter for follow-up examination after completed treatment for conditions other than malignant neoplasm: Secondary | ICD-10-CM

## 2022-02-05 DIAGNOSIS — D123 Benign neoplasm of transverse colon: Secondary | ICD-10-CM

## 2022-02-05 DIAGNOSIS — Z86010 Personal history of colonic polyps: Secondary | ICD-10-CM

## 2022-02-05 MED ORDER — SODIUM CHLORIDE 0.9 % IV SOLN: 500.0000 mL | INTRAVENOUS | Status: DC

## 2022-02-05 NOTE — Patient Instructions (Signed)
   HANDOUTS ON POLYPS, DIVERTICULOSIS AND HEMORRHOIDS GIVEN.  YOU HAD AN ENDOSCOPIC PROCEDURE TODAY AT [facility]:   Refer to the procedure report that was given to you for any specific questions about what was found during the examination.  If the procedure report does not answer your questions, please call your gastroenterologist to clarify.  If you requested that your care partner not be given the details of your procedure findings, then the procedure report has been included in a sealed envelope for you to review at your convenience later.  YOU SHOULD EXPECT: Some feelings of bloating in the abdomen. Passage of more gas than usual.  Walking can help get rid of the air that was put into your GI tract during the procedure and reduce the bloating. If you had a lower endoscopy (such as a colonoscopy or flexible sigmoidoscopy) you may notice spotting of blood in your stool or on the toilet paper. If you underwent a bowel prep for your procedure, you may not have a normal bowel movement for a few days.  Please Note:  You might notice some irritation and congestion in your nose or some drainage.  This is from the oxygen used during your procedure.  There is no need for concern and it should clear up in a day or so.  SYMPTOMS TO REPORT IMMEDIATELY:  Following lower endoscopy (colonoscopy or flexible sigmoidoscopy):  Excessive amounts of blood in the stool  Significant tenderness or worsening of abdominal pains  Swelling of the abdomen that is new, acute  Fever of 100F or higher   For urgent or emergent issues, a gastroenterologist can be reached at any hour by calling [phone]. Do not use MyChart messaging for urgent concerns.    DIET:  We do recommend a small meal at first, but then you may proceed to your regular diet.  Drink plenty of fluids but you should avoid alcoholic beverages for 24 hours.  ACTIVITY:  You should plan to take it easy for the rest of today and you  should NOT DRIVE or use heavy machinery until tomorrow (because of the sedation medicines used during the test).    FOLLOW UP: Our staff will call the number listed on your records 24-72 hours following your procedure to check on you and address any questions or concerns that you may have regarding the information given to you following your procedure. If we do not reach you, we will leave a message.  We will attempt to reach you two times.  During this call, we will ask if you have developed any symptoms of COVID 19. If you develop any symptoms (ie: fever, flu-like symptoms, shortness of breath, cough etc.) before then, please call [phone].  If you test positive for Covid 19 in the 2 weeks post procedure, please call and report this information to [place].    If any biopsies were taken you will be contacted by phone or by letter within the next 1-3 weeks.  Please call us at [phone] if you have not heard about the biopsies in 3 weeks.    SIGNATURES/CONFIDENTIALITY: You and/or your care partner have signed paperwork which will be entered into your electronic medical record.  These signatures attest to the fact that that the information above on your After Visit Summary has been reviewed and is understood.  Full responsibility of the confidentiality of this discharge information lies with you and/or your care-partner.

## 2022-02-05 NOTE — Op Note (Signed)
[facility] Patient Name: Damon Dudley Procedure Date: 02/05/2022 10:52 AM MRN: [id number] Endoscopist: Mauri Pole , MD Age: 61 Referring MD:  Date of Birth: [birth year] Gender: Male Account #: [id number] Procedure:                Colonoscopy Indications:              High risk colon cancer surveillance: Personal                            history of colonic polyps, Last colonoscopy: date                            unknown (unable to locate last colonoscopy report)                            >7 years ago Medicines:                Monitored Anesthesia Care Procedure:                Pre-Anesthesia Assessment:                           - Prior to the procedure, a History and Physical                            was performed, and patient medications and                            allergies were reviewed. The patient's tolerance of                            previous anesthesia was also reviewed. The risks                            and benefits of the procedure and the sedation                            options and risks were discussed with the patient.                            All questions were answered, and informed consent                            was obtained. Prior Anticoagulants: The patient has                            taken no previous anticoagulant or antiplatelet                            agents. ASA Grade Assessment: II - A patient with                            mild systemic disease. After reviewing the risks  and benefits, the patient was deemed in                            satisfactory condition to undergo the procedure.                           After obtaining informed consent, the colonoscope                            was passed under direct vision. Throughout the                            procedure, the patient's blood pressure, pulse, and                            oxygen saturations were monitored  continuously. The                            PCF-HQ190L Colonoscope was introduced through the                            anus and advanced to the the cecum, identified by                            appendiceal orifice and ileocecal valve. The                            colonoscopy was performed without difficulty. The                            patient tolerated the procedure well. The quality                            of the bowel preparation was good. Scope In: 11:04:21 AM Scope Out: 11:29:53 AM Scope Withdrawal Time: 0 hours 11 minutes 24 seconds  Total Procedure Duration: 0 hours 25 minutes 32 seconds  Findings:                 The perianal and digital rectal examinations were                            normal.                           Two sessile polyps were found in the transverse                            colon. The polyps were 4 to 5 mm in size. These                            polyps were removed with a cold snare. Resection                            and retrieval were complete.  A few small-mouthed diverticula were found in the                            sigmoid colon.                           Non-bleeding external and internal hemorrhoids were                            found during retroflexion. The hemorrhoids were                            medium-sized. Complications:            No immediate complications. Estimated Blood Loss:     Estimated blood loss was minimal. Impression:               - Two 4 to 5 mm polyps in the transverse colon,                            removed with a cold snare. Resected and retrieved.                           - Diverticulosis in the sigmoid colon.                           - Non-bleeding external and internal hemorrhoids. Recommendation:           - Patient has a contact number available for                            emergencies. The signs and symptoms of potential                            delayed  complications were discussed with the                            patient. Return to normal activities tomorrow.                            Written discharge instructions were provided to the                            patient.                           - Resume previous diet.                           - Continue present medications.                           - Await pathology results.                           - Repeat colonoscopy in 5-10 years for surveillance  based on pathology results. Mauri Pole, MD 02/05/2022 11:41:46 AM This report has been signed electronically.

## 2022-02-05 NOTE — Progress Notes (Signed)
Called to room to assist during endoscopic procedure.  Patient ID and intended procedure confirmed with present staff. Received instructions for my participation in the procedure from the performing physician.  
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
## 2022-02-05 NOTE — Progress Notes (Signed)
A/ox3, pleased with MAC, report to RN 

## 2022-02-06 ENCOUNTER — Telehealth: Payer: Self-pay | Admitting: *Deleted

## 2022-02-06 NOTE — Telephone Encounter (Signed)
  Follow up Call-     02/05/2022   10:23 AM  Call back number  Post procedure Call Back phone  # [phone]  Permission to leave phone message Yes     Patient questions:  Do you have a fever, pain , or abdominal swelling? No. Pain Score  0 *  Have you tolerated food without any problems? Yes.    Have you been able to return to your normal activities? Yes.    Do you have any questions about your discharge instructions: Diet   No. Medications  No. Follow up visit  No.  Do you have questions or concerns about your Care? No.  Actions: * If pain score is 4 or above: No action needed, pain <4.

## 2022-02-10 ENCOUNTER — Other Ambulatory Visit: Payer: Self-pay | Admitting: Emergency Medicine

## 2022-02-15 ENCOUNTER — Encounter: Payer: Self-pay | Admitting: Gastroenterology

## 2022-02-27 ENCOUNTER — Encounter (HOSPITAL_BASED_OUTPATIENT_CLINIC_OR_DEPARTMENT_OTHER): Payer: Self-pay | Admitting: Radiology

## 2022-02-27 ENCOUNTER — Emergency Department (HOSPITAL_BASED_OUTPATIENT_CLINIC_OR_DEPARTMENT_OTHER): Payer: Medicare HMO

## 2022-02-27 ENCOUNTER — Other Ambulatory Visit: Payer: Self-pay

## 2022-02-27 ENCOUNTER — Emergency Department (HOSPITAL_BASED_OUTPATIENT_CLINIC_OR_DEPARTMENT_OTHER)
Admission: EM | Admit: 2022-02-27 | Discharge: 2022-02-27 | Disposition: A | Discharge status: Home / Self Care | Payer: Medicare HMO | Attending: Emergency Medicine | Admitting: Emergency Medicine

## 2022-02-27 DIAGNOSIS — S3992XA Unspecified injury of lower back, initial encounter: Secondary | ICD-10-CM | POA: Diagnosis present

## 2022-02-27 DIAGNOSIS — Z79899 Other long term (current) drug therapy: Secondary | ICD-10-CM | POA: Insufficient documentation

## 2022-02-27 DIAGNOSIS — I700 Atherosclerosis of aorta: Secondary | ICD-10-CM | POA: Diagnosis not present

## 2022-02-27 DIAGNOSIS — M4316 Spondylolisthesis, lumbar region: Secondary | ICD-10-CM

## 2022-02-27 DIAGNOSIS — Z87891 Personal history of nicotine dependence: Secondary | ICD-10-CM | POA: Diagnosis not present

## 2022-02-27 DIAGNOSIS — W11XXXA Fall on and from ladder, initial encounter: Secondary | ICD-10-CM | POA: Insufficient documentation

## 2022-02-27 DIAGNOSIS — S22080A Wedge compression fracture of T11-T12 vertebra, initial encounter for closed fracture: Secondary | ICD-10-CM | POA: Diagnosis not present

## 2022-02-27 DIAGNOSIS — I10 Essential (primary) hypertension: Secondary | ICD-10-CM | POA: Insufficient documentation

## 2022-02-27 LAB — CBC WITH DIFFERENTIAL/PLATELET
Abs Immature Granulocytes: 0.01 10*3/uL (ref 0.00–0.07)
Basophils Absolute: 0 10*3/uL (ref 0.0–0.1)
Basophils Relative: 1 %
Eosinophils Absolute: 0.1 10*3/uL (ref 0.0–0.5)
Eosinophils Relative: 2 %
HCT: 44.9 % (ref 39.0–52.0)
Hemoglobin: 15.1 g/dL (ref 13.0–17.0)
Immature Granulocytes: 0 %
Lymphocytes Relative: 21 %
Lymphs Abs: 1.3 10*3/uL (ref 0.7–4.0)
MCH: 28.7 pg (ref 26.0–34.0)
MCHC: 33.6 g/dL (ref 30.0–36.0)
MCV: 85.4 fL (ref 80.0–100.0)
Monocytes Absolute: 0.6 10*3/uL (ref 0.1–1.0)
Monocytes Relative: 9 %
Neutro Abs: 4.2 10*3/uL (ref 1.7–7.7)
Neutrophils Relative %: 67 %
Platelets: 166 10*3/uL (ref 150–400)
RBC: 5.26 MIL/uL (ref 4.22–5.81)
RDW: 13.2 % (ref 11.5–15.5)
WBC: 6.2 10*3/uL (ref 4.0–10.5)
nRBC: 0 % (ref 0.0–0.2)

## 2022-02-27 LAB — COMPREHENSIVE METABOLIC PANEL
ALT: 18 U/L (ref 0–44)
AST: 20 U/L (ref 15–41)
Albumin: 5 g/dL (ref 3.5–5.0)
Alkaline Phosphatase: 56 U/L (ref 38–126)
Anion gap: 14 (ref 5–15)
BUN: 12 mg/dL (ref 8–23)
CO2: 26 mmol/L (ref 22–32)
Calcium: 10.3 mg/dL (ref 8.9–10.3)
Chloride: 96 mmol/L — ABNORMAL LOW (ref 98–111)
Creatinine, Ser: 0.54 mg/dL — ABNORMAL LOW (ref 0.61–1.24)
GFR, Estimated: >60 mL/min (ref >60)
Glucose, Bld: 114 mg/dL — ABNORMAL HIGH (ref 70–99)
Potassium: 3.7 mmol/L (ref 3.5–5.1)
Sodium: 136 mmol/L (ref 135–145)
Total Bilirubin: 1 mg/dL (ref 0.3–1.2)
Total Protein: 7.4 g/dL (ref 6.5–8.1)

## 2022-02-27 MED ORDER — MORPHINE SULFATE (PF) 4 MG/ML IV SOLN
4.0000 mg | Freq: Once | INTRAVENOUS | Status: AC
  Administered 2022-02-27: 4 mg via INTRAVENOUS
  Filled 2022-02-27: qty 1

## 2022-02-27 MED ORDER — SODIUM CHLORIDE 0.9 % IV BOLUS
500.0000 mL | Freq: Once | INTRAVENOUS | Status: AC
Start: 2022-02-27 — End: 2022-02-27
  Administered 2022-02-27: 500 mL via INTRAVENOUS

## 2022-02-27 MED ORDER — OXYCODONE HCL 5 MG PO TABS: 5.0000 mg | ORAL_TABLET | ORAL | 0 refills | Status: AC | PRN

## 2022-02-27 NOTE — Discharge Instructions (Addendum)
Your work-up today was positive for a fracture of your T12 spine.  It is stable at this time so we will manage with pain control outpatient as well as brace.  Wear your brace for the next 2 weeks until follow-up with neurosurgery regarding your fracture.  They will reimage you in 2 weeks time to make sure everything is healing okay.  Call neurosurgery tomorrow to set up an appointment.  You can use Tylenol or ibuprofen on top of the pain medicine as needed.  Please do not hesitate to return to the emergency department the worrisome signs and symptoms we discussed become apparent.

## 2022-02-27 NOTE — ED Notes (Signed)
TLSO brace application in process

## 2022-02-27 NOTE — ED Notes (Signed)
Patient transported to CT 

## 2022-02-27 NOTE — ED Provider Notes (Signed)
[facility] EMERGENCY DEPT Provider Note   CSN: [id number] Arrival date & time: 02/27/22  1214     History  Chief Complaint  Patient presents with   Fall   Back Pain    Damon Dudley is a 61 y.o. male.   Fall Pertinent negatives include no chest pain, no abdominal pain and no shortness of breath.  Back Pain Associated symptoms: no abdominal pain, no chest pain, no dysuria and no fever    61 year old male presents emergency department with low back pain.  Patient states that he was on a ladder on Sunday when he lost his balance falling backwards landing directly on his lower back.  Patient states that his feet were approximately 5 feet off the ground when the fall occurred.  He took Tylenol and applied a back brace that he had after the accident hoping the pain would go away, but the pain has been persistent and got worse.  He denies any prior back surgeries but does state that he has had some "nerve problems in his back" which has had injections for in the past.  He denies fever, bowel/bladder dysfunction, weakness, sensory deficits, saddle anesthesia, history of IV drug use, cancer diagnosis, recent steroid use.  Denies musculoskeletal pain elsewhere.  Denies chest pain, shortness of breath, abdominal pain, nausea/vomiting/diarrhea, head trauma, loss of consciousness, blood thinner use, urinary symptoms, change in bowel habits.  Home Medications Prior to Admission medications   Medication Sig Start Date End Date Taking? Authorizing Provider  oxyCODONE (ROXICODONE) 5 MG immediate release tablet Take 1 tablet (5 mg total) by mouth every 4 (four) hours as needed for up to 14 days for severe pain. 02/27/22 03/13/22 Yes Dion Saucier A, PA  atorvastatin (LIPITOR) 40 MG tablet TAKE 1 TABLET DAILY 11/24/21   Horald Pollen, MD  dicyclomine (BENTYL) 20 MG tablet Take 1 tablet (20 mg total) by mouth 2 (two) times daily. Patient not taking: Reported on 01/11/2022 09/28/21    Tedd Sias, PA  enalapril-hydrochlorothiazide (VASERETIC) 10-25 MG tablet TAKE 1 TABLET DAILY 11/01/21   Horald Pollen, MD  glipiZIDE (GLUCOTROL) 10 MG tablet TAKE 1 TABLET DAILY BEFORE BREAKFAST 09/25/21   Horald Pollen, MD  metFORMIN (GLUCOPHAGE) 1000 MG tablet TAKE 1 TABLET TWICE DAILY  WITH MEALS 09/18/21   Sagardia, Ines Bloomer, MD  [facility] VERIO test strip TEST TWO TIMES A DAY. 12/28/20   Horald Pollen, MD  tamsulosin (FLOMAX) 0.4 MG CAPS capsule Take 1 capsule (0.4 mg total) by mouth daily. 01/11/22 01/06/23  Horald Pollen, MD  TRESIBA FLEXTOUCH 100 UNIT/ML FlexTouch Pen INJECT 25 UNITS            SUBCUTANEOUSLY AT BEDTIME 09/18/21   Horald Pollen, MD  TRULICITY 1.5 YN/8.2NF SOPN INJECT 0.5ML (=1.5 MG)     SUBCUTANEOUSLY ONCE WEEKLY 02/10/22   Horald Pollen, MD      Allergies    Patient has no known allergies.    Review of Systems   Review of Systems  Constitutional:  Negative for chills and fever.  HENT:  Negative for ear pain and sore throat.   Eyes:  Negative for pain and visual disturbance.  Respiratory:  Negative for cough and shortness of breath.   Cardiovascular:  Negative for chest pain and palpitations.  Gastrointestinal:  Negative for abdominal pain and vomiting.  Genitourinary:  Negative for dysuria and hematuria.  Musculoskeletal:  Positive for back pain. Negative for arthralgias.  Skin:  Negative for  color change and rash.  Neurological:  Negative for seizures and syncope.  All other systems reviewed and are negative.   Physical Exam Updated Vital Signs BP 116/77 (BP Location: Right Arm)   Pulse 72   Temp 97.8 F (36.6 C)   Resp 18   Ht '5\' 3"'$  (1.6 m)   Wt 84 kg   SpO2 93%   BMI 32.80 kg/m  Physical Exam Vitals and nursing note reviewed.  Constitutional:      General: He is not in acute distress.    Appearance: He is well-developed.     Comments: No tender to palpation along anterior posterior rib.  No  ecchymosis or lesion or other skin abnormality noted on abdomen, anterior chest wall.  Head is normocephalic and atraumatic.  No skin abnormality noted on upper or lower extremity.  HENT:     Head: Normocephalic and atraumatic.  Eyes:     Extraocular Movements: Extraocular movements intact.     Conjunctiva/sclera: Conjunctivae normal.  Cardiovascular:     Rate and Rhythm: Normal rate and regular rhythm.     Heart sounds: No murmur heard. Pulmonary:     Effort: Pulmonary effort is normal. No respiratory distress.     Breath sounds: Normal breath sounds.  Abdominal:     Palpations: Abdomen is soft.     Tenderness: There is no abdominal tenderness.  Musculoskeletal:        General: No swelling.     Cervical back: Normal and neck supple.     Thoracic back: Normal.     Lumbar back: Bony tenderness present.     Right lower leg: No edema.     Left lower leg: No edema.     Comments: Tenderness to palpation along midline of T11/12 through lumbar spine.  No sacral tenderness.  No tenderness to palpation along posterior ribs.  No overlying skin abnormalities noted.  Posterior tibial pulses full and intact bilaterally.  Muscle strength 5/5 for lower extremities.  DTRs symmetric bilaterally.  No sensory deficits along major nerve distributions of lower extremities.  Skin:    General: Skin is warm and dry.     Capillary Refill: Capillary refill takes less than 2 seconds.  Neurological:     General: No focal deficit present.     Mental Status: He is alert and oriented to person, place, and time.  Psychiatric:        Mood and Affect: Mood normal.    ED Results / Procedures / Treatments   Labs (all labs ordered are listed, but only abnormal results are displayed) Labs Reviewed  COMPREHENSIVE METABOLIC PANEL - Abnormal; Notable for the following components:      Result Value   Chloride 96 (*)    Glucose, Bld 114 (*)    Creatinine, Ser 0.54 (*)    All other components within normal limits   CBC WITH DIFFERENTIAL/PLATELET    EKG None  Radiology CT Lumbar Spine Wo Contrast  Result Date: 02/27/2022 CLINICAL DATA:  Back pain after falling from a ladder 2 days ago EXAM: CT LUMBAR SPINE WITHOUT CONTRAST TECHNIQUE: Multidetector CT imaging of the lumbar spine was performed without intravenous contrast administration. Multiplanar CT image reconstructions were also generated. RADIATION DOSE REDUCTION: This exam was performed according to the departmental dose-optimization program which includes automated exposure control, adjustment of the mA and/or kV according to patient size and/or use of iterative reconstruction technique. COMPARISON:  CT abdomen/pelvis 09/26/2018 FINDINGS: Segmentation: Standard; the lowest formed disc space is  designated L5-S1. Alignment: There is grade 1 anterolisthesis of L4 on L5, likely degenerative in nature. There is no evidence of traumatic malalignment. Vertebrae: There is an acute compression fracture of the T12 vertebral body with up to approximately 40% loss of vertebral body height anteriorly. There is no extension into the posterior elements or bony retropulsion. The other vertebral body heights are preserved. There is no suspicious osseous lesion. Paraspinal and other soft tissues: A gallstone is noted in the gallbladder neck. The prostate is enlarged. There is calcified atherosclerotic plaque in the abdominal aorta. The paraspinal soft tissues are unremarkable. Disc levels: There is mild disc space narrowing at L4-L5. The other disc spaces are overall preserved. There is grade 1 anterolisthesis with associated moderate facet arthropathy at L4-L5 resulting in moderate spinal canal stenosis and mild-to-moderate left and mild right neural foraminal stenosis. There is no significant spinal canal or neural foraminal stenosis at the other levels. IMPRESSION: 1. Acute compression fracture of the T12 vertebral body with up to approximately 40% loss of vertebral body  height anteriorly. No extension into the posterior elements or bony retropulsion. 2. Grade 1 anterolisthesis of L4 on L5 with associated moderate facet arthropathy resulting in moderate spinal canal stenosis and mild-to-moderate left and mild right neural foraminal stenosis. 3.  Aortic Atherosclerosis (ICD10-I70.0). Electronically Signed   By: Valetta Mole M.D.   On: 02/27/2022 13:53    Procedures Procedures    Medications Ordered in ED Medications  morphine (PF) 4 MG/ML injection 4 mg (4 mg Intravenous Given 02/27/22 1317)  sodium chloride 0.9 % bolus 500 mL (0 mLs Intravenous Stopped 02/27/22 1429)  morphine (PF) 4 MG/ML injection 4 mg (4 mg Intravenous Given 02/27/22 1433)    ED Course/ Medical Decision Making/ A&P Clinical Course as of 02/27/22 1559  Tue Feb 27, 2022  1504 Dr. Kathyrn Sheriff of neurosurgery regarding the patient.  He recommended placing the patient in a course at and providing pain control and following of the patient in 2 weeks for repeat x-rays and assessment. [CR]    Clinical Course User Index [CR] Wilnette Kales, PA                           Medical Decision Making Amount and/or Complexity of Data Reviewed Labs: ordered. Radiology: ordered.  Risk Prescription drug management.   This patient presents to the ED for concern of back pain, this involves an extensive number of treatment options, and is a complaint that carries with it a high risk of complications and morbidity.  The differential diagnosis includes The emergent differential diagnosis for back pain includes but is not limited to fracture, muscle strain, cauda equina, spinal stenosis. DDD, ankylosing spondylitis, acute ligamentous injury, disk herniation, spondylolisthesis, Epidural compression syndrome, metastatic cancer, transverse myelitis, vertebral osteomyelitis, diskitis, kidney stone, pyelonephritis, AAA, Perforated ulcer, Retrocecal appendicitis, pancreatitis, bowel obstruction, retroperitoneal  hemorrhage or mass, meningitis.   Co morbidities that complicate the patient evaluation  Hypertension, hyperlipidemia, chronic back pain   Additional history obtained:  Additional history obtained from laboratory studies from 10/23/2021 External records from outside source obtained and reviewed including GFR of greater than 60 with BUN of 10 and creatinine of 0.58   Lab Tests:  I Ordered, and personally interpreted labs.  The pertinent results include: Chloride 96, glucose 114   Imaging Studies ordered:  I ordered imaging studies including CT lumbar spine I independently visualized and interpreted imaging which showed acute compression fracture of  T12 vertebral body with 40% loss of vertebral height anteriorly.  No extension into posterior elements or bony retropulsion.  Grade 1 anterolisthesis of L4 on L5 with associated moderate facet arthropathy resulting in moderate spinal canal stenosis and mild to moderate left and right neural foraminal stenosis.  Aortic atherosclerosis. I agree with the radiologist interpretation  Cardiac Monitoring: / EKG:  The patient was maintained on a cardiac monitor.  I personally viewed and interpreted the cardiac monitored which showed an underlying rhythm of: Sinus rhythm   Consultations Obtained:  I requested consultation with the Dr. Kathyrn Sheriff of neurosurgery,  and discussed lab and imaging findings as well as pertinent plan - they recommend: See ED course   Problem List / ED Course / Critical interventions / Medication management  Back pain I ordered medication including morphine for pain  Reevaluation of the patient after these medicines showed that the patient improved I have reviewed the patients home medicines and have made adjustments as needed   Social Determinants of Health:  Former cigarette use.  Denies illicit drug use.   Test / Admission - Considered:  Back pain Vitals signs significant for hypertension with blood  pressure 144/97.  Close follow-up with PCP recommended regarding further blood pressure management. Otherwise within normal range and stable throughout visit. Laboratory/imaging studies significant for: Compression fracture mentioned above.  Neurosurgery consulted and advised placing patient in TLSO brace as well as providing pain management.  Dr.Nundkumar will see the patient in 2 weeks for follow-up imaging and reevaluation.  TLSO brace placed by [employer].  Pain medication prescribed to take as needed.  Other symptomatic therapy discussed with patient.   Worrisome signs and symptoms were discussed with the patient, and the patient acknowledged understanding to return to the ED if noticed. Patient was stable upon discharge.          Final Clinical Impression(s) / ED Diagnoses Final diagnoses:  Compression fracture of T12 vertebra, initial encounter ([place])  Aortic atherosclerosis ([facility])  Anterolisthesis of lumbar spine    Rx / DC Orders ED Discharge Orders          Ordered    oxyCODONE (ROXICODONE) 5 MG immediate release tablet  Every 4 hours PRN        02/27/22 [place], [place], [place] 02/27/22 1731    Pattricia Boss, MD 02/28/22 [phone]

## 2022-02-27 NOTE — ED Triage Notes (Signed)
Patient arrives with complaints of back pain after falling off a ladder 2 days ago. Patient tried to rest at home and use an old back brace, but he hasn't  been able to get any relief.

## 2022-03-05 ENCOUNTER — Telehealth: Payer: Self-pay | Admitting: Hematology

## 2022-03-05 NOTE — Telephone Encounter (Signed)
Left message with rescheduled upcoming appointment due to provider's schedule. 

## 2022-04-23 ENCOUNTER — Other Ambulatory Visit: Payer: Self-pay

## 2022-04-23 DIAGNOSIS — C8581 Other specified types of non-Hodgkin lymphoma, lymph nodes of head, face, and neck: Secondary | ICD-10-CM

## 2022-04-24 ENCOUNTER — Inpatient Hospital Stay (HOSPITAL_BASED_OUTPATIENT_CLINIC_OR_DEPARTMENT_OTHER): Payer: Medicare HMO | Admitting: Hematology

## 2022-04-24 ENCOUNTER — Ambulatory Visit: Payer: Medicare HMO | Admitting: Hematology

## 2022-04-24 ENCOUNTER — Other Ambulatory Visit: Payer: Self-pay

## 2022-04-24 ENCOUNTER — Inpatient Hospital Stay: Payer: Medicare HMO | Attending: Hematology

## 2022-04-24 ENCOUNTER — Other Ambulatory Visit: Payer: Medicare HMO

## 2022-04-24 VITALS — BP 121/89 | HR 102 | Temp 97.8°F | Resp 16 | Wt 181.8 lb

## 2022-04-24 DIAGNOSIS — D696 Thrombocytopenia, unspecified: Secondary | ICD-10-CM | POA: Insufficient documentation

## 2022-04-24 DIAGNOSIS — Z87891 Personal history of nicotine dependence: Secondary | ICD-10-CM | POA: Insufficient documentation

## 2022-04-24 DIAGNOSIS — C8581 Other specified types of non-Hodgkin lymphoma, lymph nodes of head, face, and neck: Secondary | ICD-10-CM

## 2022-04-24 DIAGNOSIS — M439 Deforming dorsopathy, unspecified: Secondary | ICD-10-CM

## 2022-04-24 DIAGNOSIS — Z8572 Personal history of non-Hodgkin lymphomas: Secondary | ICD-10-CM | POA: Diagnosis present

## 2022-04-24 LAB — CBC WITH DIFFERENTIAL (CANCER CENTER ONLY)
Abs Immature Granulocytes: 0.01 10*3/uL (ref 0.00–0.07)
Basophils Absolute: 0 10*3/uL (ref 0.0–0.1)
Basophils Relative: 1 %
Eosinophils Absolute: 0.1 10*3/uL (ref 0.0–0.5)
Eosinophils Relative: 2 %
HCT: 40.7 % (ref 39.0–52.0)
Hemoglobin: 14.2 g/dL (ref 13.0–17.0)
Immature Granulocytes: 0 %
Lymphocytes Relative: 26 %
Lymphs Abs: 1.3 10*3/uL (ref 0.7–4.0)
MCH: 29.5 pg (ref 26.0–34.0)
MCHC: 34.9 g/dL (ref 30.0–36.0)
MCV: 84.6 fL (ref 80.0–100.0)
Monocytes Absolute: 0.4 10*3/uL (ref 0.1–1.0)
Monocytes Relative: 7 %
Neutro Abs: 3.3 10*3/uL (ref 1.7–7.7)
Neutrophils Relative %: 64 %
Platelet Count: 177 10*3/uL (ref 150–400)
RBC: 4.81 MIL/uL (ref 4.22–5.81)
RDW: 12.7 % (ref 11.5–15.5)
WBC Count: 5.2 10*3/uL (ref 4.0–10.5)
nRBC: 0 % (ref 0.0–0.2)

## 2022-04-24 LAB — CMP (CANCER CENTER ONLY)
ALT: 23 U/L (ref 0–44)
AST: 21 U/L (ref 15–41)
Albumin: 4.5 g/dL (ref 3.5–5.0)
Alkaline Phosphatase: 76 U/L (ref 38–126)
Anion gap: 7 (ref 5–15)
BUN: 10 mg/dL (ref 8–23)
CO2: 31 mmol/L (ref 22–32)
Calcium: 9.7 mg/dL (ref 8.9–10.3)
Chloride: 100 mmol/L (ref 98–111)
Creatinine: 0.48 mg/dL — ABNORMAL LOW (ref 0.61–1.24)
GFR, Estimated: >60 mL/min (ref >60)
Glucose, Bld: 109 mg/dL — ABNORMAL HIGH (ref 70–99)
Potassium: 3.6 mmol/L (ref 3.5–5.1)
Sodium: 138 mmol/L (ref 135–145)
Total Bilirubin: 0.4 mg/dL (ref 0.3–1.2)
Total Protein: 6.4 g/dL — ABNORMAL LOW (ref 6.5–8.1)

## 2022-04-24 LAB — LACTATE DEHYDROGENASE: LDH: 144 U/L (ref 98–192)
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
## 2022-04-25 ENCOUNTER — Telehealth: Payer: Self-pay | Admitting: Hematology

## 2022-04-25 NOTE — Telephone Encounter (Signed)
Scheduled follow-up appointment per 8/29 los. Patient is aware.

## 2022-05-03 ENCOUNTER — Other Ambulatory Visit: Payer: Self-pay | Admitting: Hematology

## 2022-05-03 DIAGNOSIS — C8581 Other specified types of non-Hodgkin lymphoma, lymph nodes of head, face, and neck: Secondary | ICD-10-CM

## 2022-05-03 DIAGNOSIS — M439 Deforming dorsopathy, unspecified: Secondary | ICD-10-CM

## 2022-05-04 ENCOUNTER — Other Ambulatory Visit: Payer: Self-pay | Admitting: Hematology

## 2022-05-04 DIAGNOSIS — M439 Deforming dorsopathy, unspecified: Secondary | ICD-10-CM

## 2022-05-09 ENCOUNTER — Other Ambulatory Visit: Payer: Self-pay | Admitting: Hematology

## 2022-05-09 ENCOUNTER — Ambulatory Visit
Admission: RE | Admit: 2022-05-09 | Discharge: 2022-05-09 | Disposition: A | Discharge status: Home / Self Care | Payer: Medicare HMO | Source: Ambulatory Visit | Attending: Interventional Radiology | Admitting: Interventional Radiology

## 2022-05-09 ENCOUNTER — Ambulatory Visit
Admission: RE | Admit: 2022-05-09 | Discharge: 2022-05-09 | Disposition: A | Discharge status: Home / Self Care | Payer: Medicare HMO | Source: Ambulatory Visit | Attending: Hematology | Admitting: Hematology

## 2022-05-09 VITALS — BP 134/87 | HR 95 | Temp 97.9°F | Resp 16 | Wt 180.0 lb

## 2022-05-09 DIAGNOSIS — M439 Deforming dorsopathy, unspecified: Secondary | ICD-10-CM

## 2022-05-09 HISTORY — PX: IR RADIOLOGIST EVAL & MGMT: IMG5224
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
## 2022-05-11 ENCOUNTER — Inpatient Hospital Stay: Admission: RE | Admit: 2022-05-11 | Payer: Medicare HMO | Source: Ambulatory Visit

## 2022-05-15 ENCOUNTER — Inpatient Hospital Stay: Admission: RE | Admit: 2022-05-15 | Payer: Medicare HMO | Source: Ambulatory Visit

## 2022-05-17 ENCOUNTER — Ambulatory Visit (INDEPENDENT_AMBULATORY_CARE_PROVIDER_SITE_OTHER): Payer: Medicare HMO

## 2022-05-17 ENCOUNTER — Ambulatory Visit (INDEPENDENT_AMBULATORY_CARE_PROVIDER_SITE_OTHER): Payer: Medicare HMO | Admitting: Emergency Medicine

## 2022-05-17 ENCOUNTER — Encounter: Payer: Self-pay | Admitting: Emergency Medicine

## 2022-05-17 VITALS — BP 126/80 | HR 107 | Temp 98.7°F | Ht 63.0 in | Wt 179.4 lb

## 2022-05-17 VITALS — BP 126/80 | HR 107 | Temp 98.7°F | Ht 63.0 in | Wt 179.2 lb

## 2022-05-17 DIAGNOSIS — M25561 Pain in right knee: Secondary | ICD-10-CM

## 2022-05-17 DIAGNOSIS — S22089D Unspecified fracture of T11-T12 vertebra, subsequent encounter for fracture with routine healing: Secondary | ICD-10-CM | POA: Diagnosis not present

## 2022-05-17 DIAGNOSIS — E1159 Type 2 diabetes mellitus with other circulatory complications: Secondary | ICD-10-CM

## 2022-05-17 DIAGNOSIS — C8591 Non-Hodgkin lymphoma, unspecified, lymph nodes of head, face, and neck: Secondary | ICD-10-CM | POA: Diagnosis not present

## 2022-05-17 DIAGNOSIS — Z0000 Encounter for general adult medical examination without abnormal findings: Secondary | ICD-10-CM | POA: Diagnosis not present

## 2022-05-17 DIAGNOSIS — Z23 Encounter for immunization: Secondary | ICD-10-CM

## 2022-05-17 DIAGNOSIS — I152 Hypertension secondary to endocrine disorders: Secondary | ICD-10-CM

## 2022-05-17 LAB — COMPREHENSIVE METABOLIC PANEL
ALT: 25 U/L (ref 0–53)
AST: 25 U/L (ref 0–37)
Albumin: 4.5 g/dL (ref 3.5–5.2)
Alkaline Phosphatase: 72 U/L (ref 39–117)
BUN: 15 mg/dL (ref 6–23)
CO2: 31 mEq/L (ref 19–32)
Calcium: 10 mg/dL (ref 8.4–10.5)
Chloride: 97 mEq/L (ref 96–112)
Creatinine, Ser: 0.64 mg/dL (ref 0.40–1.50)
GFR: 100.77 mL/min (ref >60.00)
Glucose, Bld: 101 mg/dL — ABNORMAL HIGH (ref 70–99)
Potassium: 3.7 mEq/L (ref 3.5–5.1)
Sodium: 138 mEq/L (ref 135–145)
Total Bilirubin: 0.6 mg/dL (ref 0.2–1.2)
Total Protein: 7 g/dL (ref 6.0–8.3)

## 2022-05-17 LAB — LIPID PANEL
Cholesterol: 125 mg/dL (ref 0–200)
HDL: 49.3 mg/dL (ref >39.00)
LDL Cholesterol: 55 mg/dL (ref 0–99)
NonHDL: 75.28
Total CHOL/HDL Ratio: 3
Triglycerides: 102 mg/dL (ref 0.0–149.0)
VLDL: 20.4 mg/dL (ref 0.0–40.0)

## 2022-05-17 LAB — MICROALBUMIN / CREATININE URINE RATIO
Creatinine,U: 246.1 mg/dL
Microalb Creat Ratio: 1.3 mg/g (ref 0.0–30.0)
Microalb, Ur: 3.1 mg/dL — ABNORMAL HIGH (ref 0.0–1.9)

## 2022-05-17 NOTE — Assessment & Plan Note (Signed)
Well-controlled pain. Scheduled for surgical procedure.  Waiting on authorization.

## 2022-05-17 NOTE — Patient Instructions (Addendum)
Mr. Damon Dudley , Thank you for taking time to come for your Medicare Wellness Visit. I appreciate your ongoing commitment to your health goals. Please review the following plan we discussed and let me know if I can assist you in the future.   These are the goals we discussed:  Goals      Continue to watch my weight and carbohydrate intake.     Try in increase my water intake from 2-8oz water per day to 3-4 8oz per day.        This is a list of the screening recommended for you and due dates:  Health Maintenance  Topic Date Due   Yearly kidney health urinalysis for diabetes  Never done   COVID-19 Vaccine (3 - Pfizer risk series) 11/04/2019   Complete foot exam   03/14/2021   Zoster (Shingles) Vaccine (1 of 2) 11/15/2022*   Hemoglobin A1C  07/14/2022   Eye exam for diabetics  12/02/2022   Yearly kidney function blood test for diabetes  04/25/2023   Colon Cancer Screening  02/05/2029   Tetanus Vaccine  06/20/2030   Flu Shot  Completed   Hepatitis C Screening: USPSTF Recommendation to screen - Ages 18-79 yo.  Completed   HIV Screening  Completed   HPV Vaccine  Aged Out  *Topic was postponed. The date shown is not the original due date.    Advanced directives: No; working on paperwork.  Conditions/risks identified: Yes; Type II Diabetes Mellitus  Next appointment: Follow up in one year for your annual wellness visit.  Preventive Care 40-64 Years, Male Preventive care refers to lifestyle choices and visits with your health care provider that can promote health and wellness. What does preventive care include? A yearly physical exam. This is also called an annual well check. Dental exams once or twice a year. Routine eye exams. Ask your health care provider how often you should have your eyes checked. Personal lifestyle choices, including: Daily care of your teeth and gums. Regular physical activity. Eating a healthy diet. Avoiding tobacco and drug use. Limiting alcohol  use. Practicing safe sex. Taking low-dose aspirin every day starting at age 78. What happens during an annual well check? The services and screenings done by your health care provider during your annual well check will depend on your age, overall health, lifestyle risk factors, and family history of disease. Counseling  Your health care provider may ask you questions about your: Alcohol use. Tobacco use. Drug use. Emotional well-being. Home and relationship well-being. Sexual activity. Eating habits. Work and work Statistician. Screening  You may have the following tests or measurements: Height, weight, and BMI. Blood pressure. Lipid and cholesterol levels. These may be checked every 5 years, or more frequently if you are over 8 years old. Skin check. Lung cancer screening. You may have this screening every year starting at age 39 if you have a 30-pack-year history of smoking and currently smoke or have quit within the past 15 years. Fecal occult blood test (FOBT) of the stool. You may have this test every year starting at age 80. Flexible sigmoidoscopy or colonoscopy. You may have a sigmoidoscopy every 5 years or a colonoscopy every 10 years starting at age 20. Prostate cancer screening. Recommendations will vary depending on your family history and other risks. Hepatitis C blood test. Hepatitis B blood test. Sexually transmitted disease (STD) testing. Diabetes screening. This is done by checking your blood sugar (glucose) after you have not eaten for a while (fasting). You  may have this done every 1-3 years. Discuss your test results, treatment options, and if necessary, the need for more tests with your health care provider. Vaccines  Your health care provider may recommend certain vaccines, such as: Influenza vaccine. This is recommended every year. Tetanus, diphtheria, and acellular pertussis (Tdap, Td) vaccine. You may need a Td booster every 10 years. Zoster vaccine. You may  need this after age 24. Pneumococcal 13-valent conjugate (PCV13) vaccine. You may need this if you have certain conditions and have not been vaccinated. Pneumococcal polysaccharide (PPSV23) vaccine. You may need one or two doses if you smoke cigarettes or if you have certain conditions. Talk to your health care provider about which screenings and vaccines you need and how often you need them. This information is not intended to replace advice given to you by your health care provider. Make sure you discuss any questions you have with your health care provider. Document Released: 09/09/2015 Document Revised: 05/02/2016 Document Reviewed: 06/14/2015 Elsevier Interactive Patient Education  2017 [place] Prevention in the Home Falls can cause injuries. They can happen to people of all ages. There are many things you can do to make your home safe and to help prevent falls. What can I do on the outside of my home? Regularly fix the edges of walkways and driveways and fix any cracks. Remove anything that might make you trip as you walk through a door, such as a raised step or threshold. Trim any bushes or trees on the path to your home. Use bright outdoor lighting. Clear any walking paths of anything that might make someone trip, such as rocks or tools. Regularly check to see if handrails are loose or broken. Make sure that both sides of any steps have handrails. Any raised decks and porches should have guardrails on the edges. Have any leaves, snow, or ice cleared regularly. Use sand or salt on walking paths during winter. Clean up any spills in your garage right away. This includes oil or grease spills. What can I do in the bathroom? Use night lights. Install grab bars by the toilet and in the tub and shower. Do not use towel bars as grab bars. Use non-skid mats or decals in the tub or shower. If you need to sit down in the shower, use a plastic, non-slip stool. Keep the floor dry.  Clean up any water that spills on the floor as soon as it happens. Remove soap buildup in the tub or shower regularly. Attach bath mats securely with double-sided non-slip rug tape. Do not have throw rugs and other things on the floor that can make you trip. What can I do in the bedroom? Use night lights. Make sure that you have a light by your bed that is easy to reach. Do not use any sheets or blankets that are too big for your bed. They should not hang down onto the floor. Have a firm chair that has side arms. You can use this for support while you get dressed. Do not have throw rugs and other things on the floor that can make you trip. What can I do in the kitchen? Clean up any spills right away. Avoid walking on wet floors. Keep items that you use a lot in easy-to-reach places. If you need to reach something above you, use a strong step stool that has a grab bar. Keep electrical cords out of the way. Do not use floor polish or wax that makes floors slippery.  If you must use wax, use non-skid floor wax. Do not have throw rugs and other things on the floor that can make you trip. What can I do with my stairs? Do not leave any items on the stairs. Make sure that there are handrails on both sides of the stairs and use them. Fix handrails that are broken or loose. Make sure that handrails are as long as the stairways. Check any carpeting to make sure that it is firmly attached to the stairs. Fix any carpet that is loose or worn. Avoid having throw rugs at the top or bottom of the stairs. If you do have throw rugs, attach them to the floor with carpet tape. Make sure that you have a light switch at the top of the stairs and the bottom of the stairs. If you do not have them, ask someone to add them for you. What else can I do to help prevent falls? Wear shoes that: Do not have high heels. Have rubber bottoms. Are comfortable and fit you well. Are closed at the toe. Do not wear sandals. If  you use a stepladder: Make sure that it is fully opened. Do not climb a closed stepladder. Make sure that both sides of the stepladder are locked into place. Ask someone to hold it for you, if possible. Clearly mark and make sure that you can see: Any grab bars or handrails. First and last steps. Where the edge of each step is. Use tools that help you move around (mobility aids) if they are needed. These include: Canes. Walkers. Scooters. Crutches. Turn on the lights when you go into a dark area. Replace any light bulbs as soon as they burn out. Set up your furniture so you have a clear path. Avoid moving your furniture around. If any of your floors are uneven, fix them. If there are any pets around you, be aware of where they are. Review your medicines with your doctor. Some medicines can make you feel dizzy. This can increase your chance of falling. Ask your doctor what other things that you can do to help prevent falls. This information is not intended to replace advice given to you by your health care provider. Make sure you discuss any questions you have with your health care provider. Document Released: 06/09/2009 Document Revised: 01/19/2016 Document Reviewed: 09/17/2014 Elsevier Interactive Patient Education  2017 [employer].

## 2022-05-17 NOTE — Patient Instructions (Signed)
Mantenimiento de la salud en los hombres Health Maintenance, Male Adoptar un estilo de vida saludable y recibir atencin preventiva son importantes para promover la salud y el bienestar. Consulte al mdico sobre: El esquema adecuado para hacerse pruebas y exmenes peridicos. Cosas que puede hacer por su cuenta para prevenir enfermedades y mantenerse sano. Qu debo saber sobre la dieta, el peso y el ejercicio? Consuma una dieta saludable  Consuma una dieta que incluya muchas verduras, frutas, productos lcteos con bajo contenido de grasa y protenas magras. No consuma muchos alimentos ricos en grasas slidas, azcares agregados o sodio. Mantenga un peso saludable El ndice de masa muscular (IMC) es una medida que puede utilizarse para identificar posibles problemas de peso. Proporciona una estimacin de la grasa corporal basndose en el peso y la altura. Su mdico puede ayudarle a determinar su IMC y a lograr o mantener un peso saludable. Haga ejercicio con regularidad Haga ejercicio con regularidad. Esta es una de las prcticas ms importantes que puede hacer por su salud. La mayora de los adultos deben seguir estas pautas: Realizar, al menos, 150 minutos de actividad fsica por semana. El ejercicio debe aumentar la frecuencia cardaca y hacerlo transpirar (ejercicio de intensidad moderada). Hacer ejercicios de fortalecimiento por lo menos dos veces por semana. Agregue esto a su plan de ejercicio de intensidad moderada. Pase menos tiempo sentado. Incluso la actividad fsica ligera puede ser beneficiosa. Controle sus niveles de colesterol y lpidos en la sangre Comience a realizarse anlisis de lpidos y colesterol en la sangre a los 20 aos y luego reptalos cada 5 aos. Es posible que necesite controlar los niveles de colesterol con mayor frecuencia si: Sus niveles de lpidos y colesterol son altos. Es mayor de 40 aos. Presenta un alto riesgo de padecer enfermedades cardacas. Qu debo  saber sobre las pruebas de deteccin del cncer? Muchos tipos de cncer pueden detectarse de manera temprana y, a menudo, pueden prevenirse. Segn su historia clnica y sus antecedentes familiares, es posible que deba realizarse pruebas de deteccin del cncer en diferentes edades. Esto puede incluir pruebas de deteccin de lo siguiente: Cncer colorrectal. Cncer de prstata. Cncer de piel. Cncer de pulmn. Qu debo saber sobre la enfermedad cardaca, la diabetes y la hipertensin arterial? Presin arterial y enfermedad cardaca La hipertensin arterial causa enfermedades cardacas y aumenta el riesgo de accidente cerebrovascular. Es ms probable que esto se manifieste en las personas que tienen lecturas de presin arterial alta o tienen sobrepeso. Hable con el mdico sobre sus valores de presin arterial deseados. Hgase controlar la presin arterial: Cada 3 a 5 aos si tiene entre 18 y 39 aos. Todos los aos si es mayor de 40 aos. Si tiene entre 65 y 75 aos y es fumador o sola fumar, pregntele al mdico si debe realizarse una prueba de deteccin de aneurisma artico abdominal (AAA) por nica vez. Diabetes Realcese exmenes de deteccin de la diabetes con regularidad. Este anlisis revisa el nivel de azcar en la sangre en ayunas. Hgase las pruebas de deteccin: Cada tres aos despus de los 45 aos de edad si tiene un peso normal y un bajo riesgo de padecer diabetes. Con ms frecuencia y a partir de una edad inferior si tiene sobrepeso o un alto riesgo de padecer diabetes. Qu debo saber sobre la prevencin de infecciones? Hepatitis B Si tiene un riesgo ms alto de contraer hepatitis B, debe someterse a un examen de deteccin de este virus. Hable con el mdico para averiguar si tiene riesgo de   contraer la infeccin por hepatitis B. Hepatitis C Se recomienda un anlisis de sangre para: Todos los que nacieron entre 1945 y 1965. Todas las personas que tengan un riesgo de haber  contrado hepatitis C. Enfermedades de transmisin sexual (ETS) Debe realizarse pruebas de deteccin de ITS todos los aos, incluidas la gonorrea y la clamidia, si: Es sexualmente activo y es menor de 24 aos. Es mayor de 24 aos, y el mdico le informa que corre riesgo de tener este tipo de infecciones. La actividad sexual ha cambiado desde que le hicieron la ltima prueba de deteccin y tiene un riesgo mayor de tener clamidia o gonorrea. Pregntele al mdico si usted tiene riesgo. Pregntele al mdico si usted tiene un alto riesgo de contraer VIH. El mdico tambin puede recomendarle un medicamento recetado para ayudar a evitar la infeccin por el VIH. Si elige tomar medicamentos para prevenir el VIH, primero debe hacerse los anlisis de deteccin del VIH. Luego debe hacerse anlisis cada 3 meses mientras est tomando los medicamentos. Siga estas indicaciones en su casa: Consumo de alcohol No beba alcohol si el mdico se lo prohbe. Si bebe alcohol: Limite la cantidad que consume de 0 a 2 bebidas por da. Sepa cunta cantidad de alcohol hay en las bebidas que toma. En los [place], una medida equivale a una botella de cerveza de 12 oz (355 ml), un vaso de vino de 5 oz (148 ml) o un vaso de una bebida alcohlica de alta graduacin de 1 oz (44 ml). Estilo de vida No consuma ningn producto que contenga nicotina o tabaco. Estos productos incluyen cigarrillos, tabaco para mascar y aparatos de vapeo, como los cigarrillos electrnicos. Si necesita ayuda para dejar de consumir estos productos, consulte al mdico. No consuma drogas. No comparta agujas. Solicite ayuda a su mdico si necesita apoyo o informacin para abandonar las drogas. Indicaciones generales Realcese los estudios de rutina de la salud, dentales y de la vista. Mantngase al da con las vacunas. Infrmele a su mdico si: Se siente deprimido con frecuencia. Alguna vez ha sido vctima de maltrato o no se siente seguro en su  casa. Resumen Adoptar un estilo de vida saludable y recibir atencin preventiva son importantes para promover la salud y el bienestar. Siga las instrucciones del mdico acerca de una dieta saludable, el ejercicio y la realizacin de pruebas o exmenes para detectar enfermedades. Siga las instrucciones del mdico con respecto al control del colesterol y la presin arterial. Esta informacin no tiene como fin reemplazar el consejo del mdico. Asegrese de hacerle al mdico cualquier pregunta que tenga. Document Revised: 01/18/2021 Document Reviewed: 01/18/2021 Elsevier Patient Education  2023 [employer]  

## 2022-05-17 NOTE — Assessment & Plan Note (Signed)
Stable.  Sees oncologist on a regular basis. 

## 2022-05-17 NOTE — Assessment & Plan Note (Signed)
Stable with unremarkable x-ray. Will need follow-up with orthopedist.

## 2022-05-17 NOTE — Assessment & Plan Note (Signed)
Well-controlled hypertension. Continue Vasireddy 10-25 mg daily. Well-controlled diabetes. Lab Results  Component Value Date   HGBA1C 6.4 (A) 01/11/2022  Continue weekly Trulicity 1.5 mg and Tresiba 25 units daily. Continue metformin 1000 mg twice a day and 10 mg of glipizide in the morning. Diet and nutrition discussed. Clear risks associated with hypertension and diabetes discussed. Follow-up in 6 months
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
## 2022-05-18 LAB — HEMOGLOBIN A1C
Hgb A1c MFr Bld: 6.3 % of total Hgb — ABNORMAL HIGH (ref <5.7)
Mean Plasma Glucose: 134 mg/dL
eAG (mmol/L): 7.4 mmol/L

## 2022-07-09 ENCOUNTER — Other Ambulatory Visit: Payer: Self-pay | Admitting: Emergency Medicine

## 2022-07-09 DIAGNOSIS — I152 Hypertension secondary to endocrine disorders: Secondary | ICD-10-CM

## 2022-07-09 DIAGNOSIS — I10 Essential (primary) hypertension: Secondary | ICD-10-CM

## 2022-07-09 NOTE — Telephone Encounter (Signed)
I just sent a new prescription for losartan HCTZ 100-25 mg daily

## 2022-07-17 ENCOUNTER — Ambulatory Visit (INDEPENDENT_AMBULATORY_CARE_PROVIDER_SITE_OTHER): Payer: Medicare HMO | Admitting: Emergency Medicine

## 2022-07-17 ENCOUNTER — Encounter: Payer: Self-pay | Admitting: Emergency Medicine

## 2022-07-17 VITALS — BP 128/88 | HR 74 | Temp 98.8°F | Ht 63.0 in | Wt 187.5 lb

## 2022-07-17 DIAGNOSIS — C8591 Non-Hodgkin lymphoma, unspecified, lymph nodes of head, face, and neck: Secondary | ICD-10-CM

## 2022-07-17 DIAGNOSIS — E1159 Type 2 diabetes mellitus with other circulatory complications: Secondary | ICD-10-CM

## 2022-07-17 DIAGNOSIS — I152 Hypertension secondary to endocrine disorders: Secondary | ICD-10-CM | POA: Diagnosis not present

## 2022-07-17 MED ORDER — TRULICITY 1.5 MG/0.5ML ~~LOC~~ SOAJ: 1.5000 mg | SUBCUTANEOUS | 3 refills | Status: DC

## 2022-07-17 NOTE — Assessment & Plan Note (Signed)
Stable.  Follows up with oncologist on a regular basis. No complications.

## 2022-07-17 NOTE — Patient Instructions (Signed)
Mantenimiento de la salud en los hombres Health Maintenance, Male Adoptar un estilo de vida saludable y recibir atencin preventiva son importantes para promover la salud y el bienestar. Consulte al mdico sobre: El esquema adecuado para hacerse pruebas y exmenes peridicos. Cosas que puede hacer por su cuenta para prevenir enfermedades y mantenerse sano. Qu debo saber sobre la dieta, el peso y el ejercicio? Consuma una dieta saludable  Consuma una dieta que incluya muchas verduras, frutas, productos lcteos con bajo contenido de grasa y protenas magras. No consuma muchos alimentos ricos en grasas slidas, azcares agregados o sodio. Mantenga un peso saludable El ndice de masa muscular (IMC) es una medida que puede utilizarse para identificar posibles problemas de peso. Proporciona una estimacin de la grasa corporal basndose en el peso y la altura. Su mdico puede ayudarle a determinar su IMC y a lograr o mantener un peso saludable. Haga ejercicio con regularidad Haga ejercicio con regularidad. Esta es una de las prcticas ms importantes que puede hacer por su salud. La mayora de los adultos deben seguir estas pautas: Realizar, al menos, 150 minutos de actividad fsica por semana. El ejercicio debe aumentar la frecuencia cardaca y hacerlo transpirar (ejercicio de intensidad moderada). Hacer ejercicios de fortalecimiento por lo menos dos veces por semana. Agregue esto a su plan de ejercicio de intensidad moderada. Pase menos tiempo sentado. Incluso la actividad fsica ligera puede ser beneficiosa. Controle sus niveles de colesterol y lpidos en la sangre Comience a realizarse anlisis de lpidos y colesterol en la sangre a los 20 aos y luego reptalos cada 5 aos. Es posible que necesite controlar los niveles de colesterol con mayor frecuencia si: Sus niveles de lpidos y colesterol son altos. Es mayor de 40 aos. Presenta un alto riesgo de padecer enfermedades cardacas. Qu debo  saber sobre las pruebas de deteccin del cncer? Muchos tipos de cncer pueden detectarse de manera temprana y, a menudo, pueden prevenirse. Segn su historia clnica y sus antecedentes familiares, es posible que deba realizarse pruebas de deteccin del cncer en diferentes edades. Esto puede incluir pruebas de deteccin de lo siguiente: Cncer colorrectal. Cncer de prstata. Cncer de piel. Cncer de pulmn. Qu debo saber sobre la enfermedad cardaca, la diabetes y la hipertensin arterial? Presin arterial y enfermedad cardaca La hipertensin arterial causa enfermedades cardacas y aumenta el riesgo de accidente cerebrovascular. Es ms probable que esto se manifieste en las personas que tienen lecturas de presin arterial alta o tienen sobrepeso. Hable con el mdico sobre sus valores de presin arterial deseados. Hgase controlar la presin arterial: Cada 3 a 5 aos si tiene entre 18 y 39 aos. Todos los aos si es mayor de 40 aos. Si tiene entre 65 y 75 aos y es fumador o sola fumar, pregntele al mdico si debe realizarse una prueba de deteccin de aneurisma artico abdominal (AAA) por nica vez. Diabetes Realcese exmenes de deteccin de la diabetes con regularidad. Este anlisis revisa el nivel de azcar en la sangre en ayunas. Hgase las pruebas de deteccin: Cada tres aos despus de los 45 aos de edad si tiene un peso normal y un bajo riesgo de padecer diabetes. Con ms frecuencia y a partir de una edad inferior si tiene sobrepeso o un alto riesgo de padecer diabetes. Qu debo saber sobre la prevencin de infecciones? Hepatitis B Si tiene un riesgo ms alto de contraer hepatitis B, debe someterse a un examen de deteccin de este virus. Hable con el mdico para averiguar si tiene riesgo de   contraer la infeccin por hepatitis B. Hepatitis C Se recomienda un anlisis de sangre para: Todos los que nacieron entre 1945 y 1965. Todas las personas que tengan un riesgo de haber  contrado hepatitis C. Enfermedades de transmisin sexual (ETS) Debe realizarse pruebas de deteccin de ITS todos los aos, incluidas la gonorrea y la clamidia, si: Es sexualmente activo y es menor de 24 aos. Es mayor de 24 aos, y el mdico le informa que corre riesgo de tener este tipo de infecciones. La actividad sexual ha cambiado desde que le hicieron la ltima prueba de deteccin y tiene un riesgo mayor de tener clamidia o gonorrea. Pregntele al mdico si usted tiene riesgo. Pregntele al mdico si usted tiene un alto riesgo de contraer VIH. El mdico tambin puede recomendarle un medicamento recetado para ayudar a evitar la infeccin por el VIH. Si elige tomar medicamentos para prevenir el VIH, primero debe hacerse los anlisis de deteccin del VIH. Luego debe hacerse anlisis cada 3 meses mientras est tomando los medicamentos. Siga estas indicaciones en su casa: Consumo de alcohol No beba alcohol si el mdico se lo prohbe. Si bebe alcohol: Limite la cantidad que consume de 0 a 2 bebidas por da. Sepa cunta cantidad de alcohol hay en las bebidas que toma. En los [place], una medida equivale a una botella de cerveza de 12 oz (355 ml), un vaso de vino de 5 oz (148 ml) o un vaso de una bebida alcohlica de alta graduacin de 1 oz (44 ml). Estilo de vida No consuma ningn producto que contenga nicotina o tabaco. Estos productos incluyen cigarrillos, tabaco para mascar y aparatos de vapeo, como los cigarrillos electrnicos. Si necesita ayuda para dejar de consumir estos productos, consulte al mdico. No consuma drogas. No comparta agujas. Solicite ayuda a su mdico si necesita apoyo o informacin para abandonar las drogas. Indicaciones generales Realcese los estudios de rutina de la salud, dentales y de la vista. Mantngase al da con las vacunas. Infrmele a su mdico si: Se siente deprimido con frecuencia. Alguna vez ha sido vctima de maltrato o no se siente seguro en su  casa. Resumen Adoptar un estilo de vida saludable y recibir atencin preventiva son importantes para promover la salud y el bienestar. Siga las instrucciones del mdico acerca de una dieta saludable, el ejercicio y la realizacin de pruebas o exmenes para detectar enfermedades. Siga las instrucciones del mdico con respecto al control del colesterol y la presin arterial. Esta informacin no tiene como fin reemplazar el consejo del mdico. Asegrese de hacerle al mdico cualquier pregunta que tenga. Document Revised: 01/18/2021 Document Reviewed: 01/18/2021 Elsevier Patient Education  2023 [employer]  

## 2022-07-17 NOTE — Assessment & Plan Note (Addendum)
Well-controlled hypertension. Continue Hyzaar 100-25 mg daily BP Readings from Last 3 Encounters:  07/17/22 128/88  05/17/22 126/80  05/17/22 126/80  Well-controlled diabetes. Continue weekly Trulicity 1.5 mg and metformin 1000 mg twice a day with glipizide 10 mg daily before breakfast and Tresiba 25 units at bedtime. Lab Results  Component Value Date   HGBA1C 6.3 (H) 05/17/2022    Cardiovascular risks associated with hypertension and diabetes discussed Diet and nutrition discussed. Continue atorvastatin 40 mg daily. Follow-up in 6 months.
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
## 2022-09-14 ENCOUNTER — Telehealth: Payer: Self-pay | Admitting: Emergency Medicine

## 2022-09-14 DIAGNOSIS — E1165 Type 2 diabetes mellitus with hyperglycemia: Secondary | ICD-10-CM

## 2022-09-14 MED ORDER — GLIPIZIDE 10 MG PO TABS: 10.0000 mg | ORAL_TABLET | Freq: Every day | ORAL | 2 refills | Status: DC

## 2022-09-14 MED ORDER — TRESIBA FLEXTOUCH 100 UNIT/ML ~~LOC~~ SOPN: PEN_INJECTOR | SUBCUTANEOUS | 2 refills | Status: DC

## 2022-09-14 MED ORDER — METFORMIN HCL 1000 MG PO TABS: 1000.0000 mg | ORAL_TABLET | Freq: Two times a day (BID) | ORAL | 2 refills | Status: DC

## 2022-09-14 NOTE — Telephone Encounter (Signed)
Caller & Relationship to patient: Elta Guadeloupe - pharmacist  Call back number: [phone] option 2  Date of last office visit: 07-17-22  Date of next office visit: 11-15-22  Medication(s) to be refilled:  glipiZIDE (GLUCOTROL) 10 MG tablet   metFORMIN (GLUCOPHAGE) 1000 MG tablet   TRESIBA FLEXTOUCH 100 UNIT/ML FlexTouch Pen     Preferred Pharmacy: [place], [place] to Registered [place] Sites     Reference number [id number]

## 2022-09-14 NOTE — Telephone Encounter (Signed)
[facility].Marland KitchenJohny Dudley

## 2022-10-04 ENCOUNTER — Ambulatory Visit: Payer: Medicare HMO | Admitting: Podiatry

## 2022-10-04 DIAGNOSIS — L600 Ingrowing nail: Secondary | ICD-10-CM

## 2022-10-04 NOTE — Progress Notes (Signed)
  Subjective:  Patient ID: Damon Dudley, male    DOB: 11-05-1960,  MRN: [id number]  Chief Complaint  Patient presents with   Diabetes    Left hallux ingrown removal, no drainage,     62 y.o. male presents with concern for pain in the left great toe.  He states that the nail has been growing at the distal tuft of the nail bed and causing pain for a long time.  He has some redness but denies any drainage.  He is hoping to have the nail removed today.  Past Medical History:  Diagnosis Date   Appendicitis    Diabetes mellitus without complication (HCC)    HLD (hyperlipidemia)    Hypertension    Lymphoma of lymph nodes (HCC)    Plantar fasciitis    Thrombocytopenia (HCC)     No Known Allergies  ROS: Negative except as per HPI above  Objective:  General: AAO x3, NAD  Dermatological: Incurvation is present along the medial laterla and distal nail border of the left great toe. There is localized edema without any erythema or increase in warmth around the nail border. There is no drainage or pus. There is no ascending cellulitis. No malodor. No open lesions or pre-ulcerative lesions.    Vascular:  Dorsalis Pedis artery and Posterior Tibial artery pedal pulses are 2/4 bilateral.  Capillary fill time < 3 sec to all digits.   Neruologic: Grossly intact via light touch bilateral. Protective threshold intact to all sites bilateral.   Musculoskeletal: No gross boney pedal deformities bilateral. No pain, crepitus, or limitation noted with foot and ankle range of motion bilateral. Muscular strength 5/5 in all groups tested bilateral.  Gait: Unassisted, Nonantalgic.   No images are attached to the encounter.  Assessment:   1. Ingrown nail of great toe of left foot      Plan:  Patient was evaluated and treated and all questions answered.    Ingrown Nail, left total nail -Patient elects to proceed with minor surgery to remove ingrown toenail today. Consent reviewed and signed by  patient. -Ingrown nail excised. See procedure note. -Educated on post-procedure care including soaking. Written instructions provided and reviewed. -Patient to follow up in 2 weeks for nail check.  Procedure: Excision of Ingrown Toenail Location: Left 1st toe  total  nail  Anesthesia: Lidocaine 1% plain; 1.5 mL and Marcaine 0.5% plain; 1.5 mL, digital block. Skin Prep: Betadine. Dressing: Silvadene; telfa; dry, sterile, compression dressing. Technique: Following skin prep, the toe was exsanguinated and a tourniquet was secured at the base of the toe. The affected nail border was freed, split with a nail splitter, and excised. Chemical matrixectomy was then performed with phenol and irrigated out with alcohol. The tourniquet was then removed and sterile dressing applied. Disposition: Patient tolerated procedure well. Patient to return in 2 weeks for follow-up.    Return in about 2 weeks (around 10/18/2022) for nail check .          Everitt Amber, DPM [facility]

## 2022-10-04 NOTE — Patient Instructions (Addendum)

## 2022-10-18 ENCOUNTER — Ambulatory Visit: Payer: Medicare HMO | Admitting: Podiatry

## 2022-10-18 DIAGNOSIS — L600 Ingrowing nail: Secondary | ICD-10-CM | POA: Diagnosis not present

## 2022-10-18 NOTE — Progress Notes (Signed)
Subjective: Damon Dudley is a 62 y.o.  male returns to office today for follow up evaluation after having left Hallux total nail nail ingrown removal with phenol and alcohol matrixectomy approximately 2 weeks ago. Patient has been soaking using epsom salt initially then vinegar and applying topical antibiotic covered with bandaid daily. Patient denies fevers, chills, nausea, vomiting. Denies any calf pain, chest pain, SOB.   Objective:  Vitals: Reviewed  General: Well developed, nourished, in no acute distress, alert and oriented x3   Dermatology: Skin is warm, dry and supple bilateral. Left hallux nail border appears to be clean, dry, with mild granular tissue and surrounding scab. There is no surrounding erythema, edema, drainage/purulence. The remaining nails appear unremarkable at this time. There are no other lesions or other signs of infection present.  Neurovascular status: Intact. No lower extremity swelling; No pain with calf compression bilateral.  Musculoskeletal: Decreased tenderness to palpation of the left hallux nail fold(s). Muscular strength within normal limits bilateral.   Assesement and Plan: S/p phenol and alcohol matrixectomy to the  left hallux nail total nail, doing well.   -Continue soaking in epsom salts twice a day followed by antibiotic ointment and a band-aid. Can leave uncovered at night. Continue this until completely healed.  -If the area has not healed in 2 weeks, call the office for follow-up appointment, or sooner if any problems arise.  -Monitor for any signs/symptoms of infection. Call the office immediately if any occur or go directly to the emergency room. Call with any questions/concerns.        Everitt Amber, [place] / [facility]                   10/18/2022

## 2022-11-05 ENCOUNTER — Encounter: Payer: Self-pay | Admitting: Emergency Medicine

## 2022-11-05 ENCOUNTER — Ambulatory Visit (INDEPENDENT_AMBULATORY_CARE_PROVIDER_SITE_OTHER): Payer: Medicare HMO | Admitting: Emergency Medicine

## 2022-11-05 VITALS — BP 120/74 | HR 92 | Temp 98.2°F | Ht 63.0 in | Wt 181.1 lb

## 2022-11-05 DIAGNOSIS — E1159 Type 2 diabetes mellitus with other circulatory complications: Secondary | ICD-10-CM | POA: Diagnosis not present

## 2022-11-05 DIAGNOSIS — I152 Hypertension secondary to endocrine disorders: Secondary | ICD-10-CM | POA: Diagnosis not present

## 2022-11-05 DIAGNOSIS — C8591 Non-Hodgkin lymphoma, unspecified, lymph nodes of head, face, and neck: Secondary | ICD-10-CM | POA: Diagnosis not present

## 2022-11-05 DIAGNOSIS — G9331 Postviral fatigue syndrome: Secondary | ICD-10-CM | POA: Insufficient documentation

## 2022-11-05 DIAGNOSIS — H8303 Labyrinthitis, bilateral: Secondary | ICD-10-CM | POA: Diagnosis not present

## 2022-11-05 LAB — CBC WITH DIFFERENTIAL/PLATELET
Basophils Absolute: 0 10*3/uL (ref 0.0–0.1)
Basophils Relative: 0.5 % (ref 0.0–3.0)
Eosinophils Absolute: 0.2 10*3/uL (ref 0.0–0.7)
Eosinophils Relative: 2.6 % (ref 0.0–5.0)
HCT: 43.2 % (ref 39.0–52.0)
Hemoglobin: 14.6 g/dL (ref 13.0–17.0)
Lymphocytes Relative: 12.6 % (ref 12.0–46.0)
Lymphs Abs: 1.2 10*3/uL (ref 0.7–4.0)
MCHC: 33.7 g/dL (ref 30.0–36.0)
MCV: 86.9 fl (ref 78.0–100.0)
Monocytes Absolute: 0.7 10*3/uL (ref 0.1–1.0)
Monocytes Relative: 7.4 % (ref 3.0–12.0)
Neutro Abs: 7.3 10*3/uL (ref 1.4–7.7)
Neutrophils Relative %: 76.9 % (ref 43.0–77.0)
Platelets: 255 10*3/uL (ref 150.0–400.0)
RBC: 4.97 Mil/uL (ref 4.22–5.81)
RDW: 14.7 % (ref 11.5–15.5)
WBC: 9.5 10*3/uL (ref 4.0–10.5)

## 2022-11-05 LAB — COMPREHENSIVE METABOLIC PANEL
ALT: 25 U/L (ref 0–53)
AST: 26 U/L (ref 0–37)
Albumin: 4.2 g/dL (ref 3.5–5.2)
Alkaline Phosphatase: 59 U/L (ref 39–117)
BUN: 17 mg/dL (ref 6–23)
CO2: 31 mEq/L (ref 19–32)
Calcium: 10 mg/dL (ref 8.4–10.5)
Chloride: 100 mEq/L (ref 96–112)
Creatinine, Ser: 0.67 mg/dL (ref 0.40–1.50)
GFR: 99.05 mL/min (ref >60.00)
Glucose, Bld: 84 mg/dL (ref 70–99)
Potassium: 3.7 mEq/L (ref 3.5–5.1)
Sodium: 140 mEq/L (ref 135–145)
Total Bilirubin: 0.5 mg/dL (ref 0.2–1.2)
Total Protein: 6.6 g/dL (ref 6.0–8.3)

## 2022-11-05 LAB — HEMOGLOBIN A1C: Hgb A1c MFr Bld: 6.9 % — ABNORMAL HIGH (ref 4.6–6.5)

## 2022-11-05 NOTE — Assessment & Plan Note (Signed)
Secondary to upper viral respiratory infection and leading to intermittent episodes of dizziness.  Running its course and slowly improving.  No complications.

## 2022-11-05 NOTE — Assessment & Plan Note (Signed)
Stable.  Follows up with oncologist on a regular basis. 

## 2022-11-05 NOTE — Assessment & Plan Note (Signed)
Including symptoms of dizziness and vertigo.  Clinically stable. No red flag signs or symptoms.  Virtually asymptomatic today. Advised to monitor symptoms and contact the office if no better or worse during the next several days.
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
## 2022-11-05 NOTE — Assessment & Plan Note (Signed)
Well-controlled hypertension. Continue Hyzaar 100-25 mg daily. BP Readings from Last 3 Encounters:  11/05/22 120/74  07/17/22 128/88  05/17/22 126/80  Well-controlled diabetes.  Continue metformin 1000 mg 2 times daily with glipizide 10 mg in the morning Continue weekly Trulicity 1.5 mg Cardiovascular risks associated with hypertension and diabetes discussed. Diet and nutrition discussed. Blood work done today. Continue atorvastatin 40 mg daily.

## 2022-11-05 NOTE — Patient Instructions (Signed)
Mareos Dizziness Los mareos son un problema muy frecuente. Causan sensacin de inestabilidad o de desvanecimiento. Puede sentir que se va a desmayar. Los mareos pueden provocarle una lesin si se tropieza o se cae. La causa puede deberse a muchos problemas, tales como los siguientes: Medicamentos. No tener suficiente agua en el cuerpo (deshidratacin). Enfermedad. Siga estas instrucciones en su casa: Comida y bebida  Beba suficiente lquido para mantener el pis (orina) de color amarillo plido. Esto evita la deshidratacin. Trate de beber ms lquidos transparentes, como agua. No beba alcohol. Limite la cantidad de cafena que bebe o come si el mdico se lo indica. Limite la cantidad de sal (sodio) que bebe o come si el mdico se lo indica. Actividad  Evite los movimientos rpidos. Cuando se levante de una silla, hgalo con lentitud y sujtese hasta sentirse bien. Por la maana, sintese primero a un lado de la cama. Cuando se sienta bien, pngase lentamente de pie mientras se sostiene de algo. Haga esto hasta que se sienta seguro en cuanto al equilibrio. Mueva las piernas con frecuencia si debe estar de pie en un lugar durante mucho tiempo. Mientras est de pie, contraiga y relaje los msculos de las piernas. No conduzca vehculos ni opere maquinaria si se siente mareado. Evite agacharse si se siente mareado. En su casa, coloque los objetos en algn lugar que le resulte fcil alcanzarlos sin agacharse. Estilo de vida No fume ni consuma ningn producto que contenga nicotina o tabaco. Si necesita ayuda para dejar de fumar, consulte al mdico. Intente bajar el nivel de estrs. Para hacerlo, puede usar mtodos como el yoga o la meditacin. Hable con el mdico si necesita ayuda. Instrucciones generales Controle sus mareos para ver si hay cambios. Use los medicamentos de venta libre y los recetados solamente como se lo haya indicado el mdico. Hable con el mdico si cree que la causa de sus  mareos es algn medicamento que est tomando. Infrmele a un amigo o a un familiar si se siente mareado. Pdale a esta persona que llame al mdico si observa cambios en su comportamiento. Concurra a todas las visitas de seguimiento. Comunquese con un mdico si: Los mareos persisten. Los mareos o la sensacin de desvanecimiento empeoran. Tiene ganas de vomitar (nuseas). Tiene problemas para escuchar. Aparecen nuevos sntomas. Siente inestabilidad al estar de pie. Siente que la habitacin da vueltas. Dolor o rigidez en el cuello. Tiene fiebre. Solicite ayuda de inmediato si: Vomita o tiene heces acuosas (diarrea), y no puede comer o beber nada. Tiene dificultad para hacer lo siguiente: Hablar. Caminar. Tragar. Usar los brazos, las manos o las piernas. Se siente constantemente dbil. No piensa con claridad o tiene dificultad para armar oraciones. Es posible que un amigo o un familiar adviertan que esto ocurre. Tiene los siguientes sntomas: Dolor en el pecho. Dolor en el vientre (abdomen). Falta de aire. Sudoracin. Presenta cambios en la visin. Sangrado. Tiene un dolor de cabeza muy intenso. Estos sntomas pueden indicar una emergencia. Solicite ayuda de inmediato. Comunquese con el servicio de emergencias de su localidad (911 en los [place]). No espere a ver si los sntomas desaparecen. No conduzca por sus propios medios hasta el hospital. Resumen Los mareos causan sensacin de inestabilidad o de desvanecimiento. Puede sentir que se va a desmayar. Beba suficiente lquido para mantener el pis (orina) de color amarillo plido. No beba alcohol. Evite los movimientos rpidos si se siente mareado. Controle sus mareos para ver si hay cambios. Esta informacin no tiene como fin   reemplazar el consejo del mdico. Asegrese de hacerle al mdico cualquier pregunta que tenga. Document Revised: 08/08/2020 Document Reviewed: 08/08/2020 Elsevier Patient Education  2023 Elsevier  Inc.  

## 2022-11-07 ENCOUNTER — Other Ambulatory Visit: Payer: Self-pay | Admitting: Emergency Medicine

## 2022-11-07 DIAGNOSIS — E1165 Type 2 diabetes mellitus with hyperglycemia: Secondary | ICD-10-CM

## 2022-11-14 ENCOUNTER — Telehealth: Payer: Self-pay | Admitting: Hematology

## 2022-11-14 NOTE — Telephone Encounter (Signed)
Called patient per provider PAL to reschedule 8/30 appointments. Patient rescheduled and notified.

## 2022-11-15 ENCOUNTER — Ambulatory Visit: Payer: Medicare HMO | Admitting: Emergency Medicine

## 2023-01-15 ENCOUNTER — Ambulatory Visit: Payer: Medicare HMO | Admitting: Emergency Medicine

## 2023-03-08 ENCOUNTER — Other Ambulatory Visit: Payer: Self-pay | Admitting: Emergency Medicine

## 2023-03-11 ENCOUNTER — Ambulatory Visit: Payer: Medicare HMO | Admitting: Podiatry

## 2023-03-11 DIAGNOSIS — L600 Ingrowing nail: Secondary | ICD-10-CM | POA: Diagnosis not present

## 2023-03-11 NOTE — Patient Instructions (Signed)

## 2023-03-11 NOTE — Progress Notes (Signed)
Subjective: Chief Complaint  Patient presents with   Ingrown Toenail    Patient came in today for right hallux ingrown, medial border, blood, started 3 weeks ago,    62 year old male presents the office with above concerns.  He states that he did well after the nail was removed however recently has come back causing pain.  His wife is try to help trim the corner.  No purulence.  Last A1c was 6.9 on November 05, 2022  Objective: AAO x3, NAD DP/PT pulses palpable bilaterally, CRT less than 3 seconds Present the patient present nail border with tenderness palpation.  Nail has grown back again.  There is no drainage or pus noted today.  There is no fluctuation or crepitation present. No pain with calf compression, swelling, warmth, erythema  Assessment: Ingrown toenail right hallux  Plan: -All treatment options discussed with the patient including all alternatives, risks, complications.  -At this time, the patient is requesting total nail removal with chemical matricectomy.Risks and complications were discussed with the patient for which they understand and written consent was obtained. Under sterile conditions a total of 3 mL of a mixture of 2% lidocaine plain and 0.5% Marcaine plain was infiltrated in a hallux block fashion. Once anesthetized, the skin was prepped in sterile fashion. A tourniquet was then applied. Next the skin was removed in total making to remove all nail borders.  Once the nails were ensured to be removed area was debrided and the underlying skin was intact. There is no purulence identified in the procedure. Next phenol was then applied under standard conditions and copiously irrigated.  Silvadene was applied. A dry sterile dressing was applied. After application of the dressing the tourniquet was removed and there is found to be an immediate capillary refill time to the digit. The patient tolerated the procedure well any complications. Post procedure instructions were discussed  the patient for which he verbally understood. Discussed signs/symptoms of infection and directed to call the office immediately should any occur or go directly to the emergency room. In the meantime, encouraged to call the office with any questions, concerns, changes symptoms. -Patient encouraged to call the office with any questions, concerns, change in symptoms.   Damon Dudley DPM

## 2023-03-28 ENCOUNTER — Encounter: Payer: Self-pay | Admitting: Podiatry

## 2023-03-28 ENCOUNTER — Ambulatory Visit: Payer: Medicare HMO | Admitting: Podiatry

## 2023-03-28 ENCOUNTER — Ambulatory Visit: Payer: Medicare HMO

## 2023-03-28 DIAGNOSIS — L600 Ingrowing nail: Secondary | ICD-10-CM

## 2023-03-28 DIAGNOSIS — S99921A Unspecified injury of right foot, initial encounter: Secondary | ICD-10-CM

## 2023-03-28 NOTE — Progress Notes (Signed)
Subjective: Chief Complaint  Patient presents with   toe nail f/u   62 year old male presents with above concerns.  Presents today for follow evaluation after undergoing nail removal.  He states that he did have an injury to the big toe which may have delayed healing as it was bruised and swollen.  So there is a little bit of clear drainage but no pus.  No fever or chills that he reports.  Objective: AAO x3, NAD DP/PT pulses palpable bilaterally, CRT less than 3 seconds Status post toenail removal.  Granulation tissue present along the central aspect of the wound.  There is some localized edema around the nail.  There is minimal bruising present to the toe.  No drainage or purulence noted today.  No ascending cellulitis.  Minimal swelling, ecchymosis of the digit. No pain with calf compression, swelling, warmth, erythema  Assessment: 62 year old male status post nail removal, contusion  Plan: -All treatment options discussed with the patient including all alternatives, risks, complications.  -X-rays obtained reviewed.  3 views of the foot were obtained.  No evidence of acute fracture noted.  Mild narrowing of the first MTPJ. -Contusion appears to be healing well.  Stiffer soled shoe. -Continue soaking Epsom salts, antibiotic ointment and a bandage in the day but can leave the area open at nighttime.  Monitor for any signs or symptoms of infection. -Patient encouraged to call the office with any questions, concerns, change in symptoms.   [name] DPM

## 2023-04-11 ENCOUNTER — Encounter (INDEPENDENT_AMBULATORY_CARE_PROVIDER_SITE_OTHER): Payer: Self-pay

## 2023-04-15 ENCOUNTER — Ambulatory Visit: Payer: Medicare HMO | Admitting: Podiatry

## 2023-04-26 ENCOUNTER — Ambulatory Visit: Payer: Medicare HMO | Admitting: Hematology

## 2023-04-26 ENCOUNTER — Other Ambulatory Visit: Payer: Medicare HMO

## 2023-05-02 ENCOUNTER — Other Ambulatory Visit: Payer: Self-pay

## 2023-05-02 DIAGNOSIS — C8581 Other specified types of non-Hodgkin lymphoma, lymph nodes of head, face, and neck: Secondary | ICD-10-CM

## 2023-05-03 ENCOUNTER — Inpatient Hospital Stay: Payer: Medicare HMO | Attending: Hematology

## 2023-05-03 ENCOUNTER — Inpatient Hospital Stay (HOSPITAL_BASED_OUTPATIENT_CLINIC_OR_DEPARTMENT_OTHER): Payer: Medicare HMO | Admitting: Hematology

## 2023-05-03 VITALS — BP 121/70 | HR 82 | Temp 97.9°F | Resp 20 | Wt 183.0 lb

## 2023-05-03 DIAGNOSIS — Z8572 Personal history of non-Hodgkin lymphomas: Secondary | ICD-10-CM | POA: Diagnosis present

## 2023-05-03 DIAGNOSIS — C8581 Other specified types of non-Hodgkin lymphoma, lymph nodes of head, face, and neck: Secondary | ICD-10-CM

## 2023-05-03 DIAGNOSIS — D696 Thrombocytopenia, unspecified: Secondary | ICD-10-CM | POA: Insufficient documentation

## 2023-05-03 DIAGNOSIS — Z79899 Other long term (current) drug therapy: Secondary | ICD-10-CM | POA: Insufficient documentation

## 2023-05-03 DIAGNOSIS — Z87891 Personal history of nicotine dependence: Secondary | ICD-10-CM | POA: Diagnosis not present

## 2023-05-03 LAB — CMP (CANCER CENTER ONLY)
ALT: 17 U/L (ref 0–44)
AST: 20 U/L (ref 15–41)
Albumin: 4.3 g/dL (ref 3.5–5.0)
Alkaline Phosphatase: 71 U/L (ref 38–126)
Anion gap: 9 (ref 5–15)
BUN: 14 mg/dL (ref 8–23)
CO2: 28 mmol/L (ref 22–32)
Calcium: 9.4 mg/dL (ref 8.9–10.3)
Chloride: 102 mmol/L (ref 98–111)
Creatinine: 0.79 mg/dL (ref 0.61–1.24)
GFR, Estimated: >60 mL/min (ref >60)
Glucose, Bld: 132 mg/dL — ABNORMAL HIGH (ref 70–99)
Potassium: 3.7 mmol/L (ref 3.5–5.1)
Sodium: 139 mmol/L (ref 135–145)
Total Bilirubin: 0.5 mg/dL (ref 0.3–1.2)
Total Protein: 6.3 g/dL — ABNORMAL LOW (ref 6.5–8.1)

## 2023-05-03 LAB — CBC WITH DIFFERENTIAL (CANCER CENTER ONLY)
Abs Immature Granulocytes: 0.01 10*3/uL (ref 0.00–0.07)
Basophils Absolute: 0 10*3/uL (ref 0.0–0.1)
Basophils Relative: 1 %
Eosinophils Absolute: 0.1 10*3/uL (ref 0.0–0.5)
Eosinophils Relative: 2 %
HCT: 38.9 % — ABNORMAL LOW (ref 39.0–52.0)
Hemoglobin: 13.5 g/dL (ref 13.0–17.0)
Immature Granulocytes: 0 %
Lymphocytes Relative: 22 %
Lymphs Abs: 1.1 10*3/uL (ref 0.7–4.0)
MCH: 29.7 pg (ref 26.0–34.0)
MCHC: 34.7 g/dL (ref 30.0–36.0)
MCV: 85.7 fL (ref 80.0–100.0)
Monocytes Absolute: 0.3 10*3/uL (ref 0.1–1.0)
Monocytes Relative: 7 %
Neutro Abs: 3.5 10*3/uL (ref 1.7–7.7)
Neutrophils Relative %: 68 %
Platelet Count: 172 10*3/uL (ref 150–400)
RBC: 4.54 MIL/uL (ref 4.22–5.81)
RDW: 13 % (ref 11.5–15.5)
WBC Count: 5 10*3/uL (ref 4.0–10.5)
nRBC: 0 % (ref 0.0–0.2)

## 2023-05-03 LAB — LACTATE DEHYDROGENASE: LDH: 131 U/L (ref 98–192)

## 2023-05-08 ENCOUNTER — Ambulatory Visit (INDEPENDENT_AMBULATORY_CARE_PROVIDER_SITE_OTHER): Payer: Medicare HMO | Admitting: Emergency Medicine

## 2023-05-08 ENCOUNTER — Telehealth: Payer: Self-pay | Admitting: Emergency Medicine

## 2023-05-08 ENCOUNTER — Encounter: Payer: Self-pay | Admitting: Emergency Medicine

## 2023-05-08 ENCOUNTER — Other Ambulatory Visit: Payer: Self-pay | Admitting: *Deleted

## 2023-05-08 VITALS — BP 124/88 | HR 66 | Temp 98.3°F | Ht 63.0 in | Wt 179.2 lb

## 2023-05-08 DIAGNOSIS — Z7985 Long-term (current) use of injectable non-insulin antidiabetic drugs: Secondary | ICD-10-CM

## 2023-05-08 DIAGNOSIS — E1159 Type 2 diabetes mellitus with other circulatory complications: Secondary | ICD-10-CM | POA: Diagnosis not present

## 2023-05-08 DIAGNOSIS — I700 Atherosclerosis of aorta: Secondary | ICD-10-CM | POA: Insufficient documentation

## 2023-05-08 DIAGNOSIS — I152 Hypertension secondary to endocrine disorders: Secondary | ICD-10-CM

## 2023-05-08 DIAGNOSIS — N402 Nodular prostate without lower urinary tract symptoms: Secondary | ICD-10-CM

## 2023-05-08 DIAGNOSIS — C8581 Other specified types of non-Hodgkin lymphoma, lymph nodes of head, face, and neck: Secondary | ICD-10-CM | POA: Diagnosis not present

## 2023-05-08 DIAGNOSIS — D696 Thrombocytopenia, unspecified: Secondary | ICD-10-CM | POA: Diagnosis not present

## 2023-05-08 LAB — POCT GLYCOSYLATED HEMOGLOBIN (HGB A1C): Hemoglobin A1C: 6.2 % — AB (ref 4.0–5.6)

## 2023-05-08 MED ORDER — TIRZEPATIDE 7.5 MG/0.5ML ~~LOC~~ SOAJ: 7.5000 mg | SUBCUTANEOUS | 3 refills | Status: DC

## 2023-05-08 MED ORDER — TIRZEPATIDE 7.5 MG/0.5ML ~~LOC~~ SOAJ: 7.5000 mg | SUBCUTANEOUS | 3 refills | Status: AC

## 2023-05-08 NOTE — Assessment & Plan Note (Signed)
Stable and well-controlled.  Asymptomatic. Most recent oncologist office visit notes reviewed with patient No concerns.

## 2023-05-08 NOTE — Assessment & Plan Note (Signed)
BP Readings from Last 3 Encounters:  05/08/23 124/88  05/03/23 121/70  11/05/22 120/74  Well-controlled hypertension Continue Hyzaar 100-25 mg daily Well-controlled diabetes with hemoglobin A1c 6.2 Lab Results  Component Value Date   HGBA1C 6.2 (A) 05/08/2023  Trulicity will no longer be covered by insurance Recommend Mounjaro 7.5 mg weekly Patient not presently taking glipizide Continue metformin 1000 mg twice a day and Tresiba 25 units daily Cardiovascular risks associated with hypertension and diabetes discussed Diet and nutrition discussed

## 2023-05-08 NOTE — Telephone Encounter (Signed)
Patient is not on Trulicity

## 2023-05-08 NOTE — Telephone Encounter (Signed)
Per his insurance company please send Trulicity to [facility] on [address], [place]
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
## 2023-05-08 NOTE — Patient Instructions (Signed)

## 2023-05-08 NOTE — Assessment & Plan Note (Signed)
Diet and nutrition discussed. Continue atorvastatin 40 mg daily. 

## 2023-05-08 NOTE — Assessment & Plan Note (Signed)
Normal recent platelet count.  No concerns.

## 2023-05-08 NOTE — Telephone Encounter (Signed)
Mounjaro sent to patient requested pharmacy.

## 2023-05-08 NOTE — Assessment & Plan Note (Signed)
With lower urinary tract symptoms Tamsulosin 0.4 mg daily helping
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
## 2023-05-23 ENCOUNTER — Ambulatory Visit: Payer: Medicare HMO

## 2023-05-23 VITALS — Ht 63.0 in | Wt 179.0 lb

## 2023-05-23 DIAGNOSIS — Z0000 Encounter for general adult medical examination without abnormal findings: Secondary | ICD-10-CM | POA: Diagnosis not present
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
## 2023-05-23 NOTE — Patient Instructions (Addendum)
Damon Dudley , Thank you for taking time to come for your Medicare Wellness Visit. I appreciate your ongoing commitment to your health goals. Please review the following plan we discussed and let me know if I can assist you in the future.   Referrals/Orders/Follow-Ups/Clinician Recommendations:   This is a list of the screening recommended for you and due dates:  Health Maintenance  Topic Date Due   Zoster (Shingles) Vaccine (1 of 2) Never done   COVID-19 Vaccine (3 - Pfizer risk series) 11/04/2019   Complete foot exam   03/14/2021   Eye exam for diabetics  12/02/2022   Flu Shot  03/28/2023   Yearly kidney health urinalysis for diabetes  05/18/2023   Hemoglobin A1C  11/05/2023   Yearly kidney function blood test for diabetes  05/02/2024   Medicare Annual Wellness Visit  05/22/2024   Colon Cancer Screening  02/05/2029   DTaP/Tdap/Td vaccine (2 - Td or Tdap) 06/20/2030   Hepatitis C Screening  Completed   HIV Screening  Completed   HPV Vaccine  Aged Out    Advanced directives: (Copy Requested) Please bring a copy of your health care power of attorney and living will to the office to be added to your chart at your convenience.  Next Medicare Annual Wellness Visit scheduled for next year: Yes  Insert preventive care Attachment reference

## 2023-05-29 ENCOUNTER — Other Ambulatory Visit: Payer: Self-pay | Admitting: Emergency Medicine

## 2023-05-29 DIAGNOSIS — E1165 Type 2 diabetes mellitus with hyperglycemia: Secondary | ICD-10-CM

## 2023-06-15 ENCOUNTER — Other Ambulatory Visit: Payer: Self-pay | Admitting: Emergency Medicine

## 2023-06-15 DIAGNOSIS — E1165 Type 2 diabetes mellitus with hyperglycemia: Secondary | ICD-10-CM

## 2023-07-11 ENCOUNTER — Other Ambulatory Visit: Payer: Self-pay | Admitting: Emergency Medicine

## 2023-07-11 DIAGNOSIS — I152 Hypertension secondary to endocrine disorders: Secondary | ICD-10-CM

## 2023-07-11 DIAGNOSIS — I10 Essential (primary) hypertension: Secondary | ICD-10-CM

## 2023-08-20 ENCOUNTER — Ambulatory Visit: Payer: Self-pay | Admitting: Emergency Medicine

## 2023-08-20 NOTE — Telephone Encounter (Signed)
Chief Complaint: cough, weakness  Symptoms: dry cough, chills, body aches, dizziness, weakness, chest tightness, SOB, runny nose, diarrhea  Frequency: x 2 weeks  Pertinent Negatives: Patient denies headaches, syncope  Disposition: [x] ED /[] Urgent Care (no appt availability in office) / [] Appointment(In office/virtual)/ []  [facility]/ [] Home Care/ [x] Refused Recommended Disposition /[] [facility]/ []  Follow-up with PCP Additional Notes: Pt states weakness, decreased appetite and diarrhea started when he switched from [place] to [place] and would like to speak with his PCP about the side effects. Pt c/o cough, chills, aches x 2 weeks and states it is worsening. Advised pt to go to ED, he states he does not wish to go due to the holidays. Educated pt on emergency of symptoms (chest pain, dizziness, SOB) and need to be seen by a provider. Pt verbalizes understanding and refuses ED or urgent care. Pt states he wants to wait a few days and see how he feels. Pt educated on concerning symptoms to call back for and when to call EMS; verbalizes understanding.   Copied from CRM [id number]. Topic: Clinical - Red Word Triage >> Aug 20, 2023 11:04 AM Leavy Cella D wrote: Red Word that prompted transfer to Nurse Triage: Fever , not eating , chills going on for 2 weeks Reason for Disposition  [1] MODERATE difficulty breathing (e.g., speaks in phrases, SOB even at rest, pulse 100-120) AND [2] still present when not coughing  Answer Assessment - Initial Assessment Questions 1. ONSET: "When did the cough begin?"      X 2 weeks and worsening. Worst at night when lying down.  2. SEVERITY: "How bad is the cough today?"      Mild.  3. SPUTUM: "Describe the color of your sputum" (none, dry cough; clear, white, yellow, green)     Denies sputum.  4. HEMOPTYSIS: "Are you coughing up any blood?" If so ask: "How much?" (flecks, streaks, tablespoons, etc.)     Denies blood.  5. DIFFICULTY  BREATHING: "Are you having difficulty breathing?" If Yes, ask: "How bad is it?" (e.g., mild, moderate, severe)    - MILD: No SOB at rest, mild SOB with walking, speaks normally in sentences, can lie down, no retractions, pulse < 100.    - MODERATE: SOB at rest, SOB with minimal exertion and prefers to sit, cannot lie down flat, speaks in phrases, mild retractions, audible wheezing, pulse 100-120.    - SEVERE: Very SOB at rest, speaks in single words, struggling to breathe, sitting hunched forward, retractions, pulse > 120      Pt reports severe SOB. Pt able to speak in full sentences.   6. FEVER: "Do you have a fever?" If Yes, ask: "What is your temperature, how was it measured, and when did it start?"     Pt states he has had chills and feeling feverish but has not checked his temperature.  7. CARDIAC HISTORY: "Do you have any history of heart disease?" (e.g., heart attack, congestive heart failure)      Denies cardiac history.  8. LUNG HISTORY: "Do you have any history of lung disease?"  (e.g., pulmonary embolus, asthma, emphysema)     Denies lung history.  9. PE RISK FACTORS: "Do you have a history of blood clots?" (or: recent major surgery, recent prolonged travel, bedridden)     Denies history of blood clots or recent surgeries.  10. OTHER SYMPTOMS: "Do you have any other symptoms?" (e.g., runny nose, wheezing, chest pain)       Cough,  chills, body aches, dizziness, weakness, decreased appetite, diarrhea, runny nose. Chest tightness when coughing.  11. TRAVEL: "Have you traveled out of the country in the last month?" (e.g., travel history, exposures)       Denies travel out of the country or known exposure to anyone that is ill. Pt states a few days ago he spent time outside in the cold and only wore a light jacket, states he felt chills and worsened after that.  Protocols used: Cough - Acute Non-Productive-A-AH

## 2023-08-20 NOTE — Telephone Encounter (Signed)
Patient is scheduled for 09/05/2023 @9 :40 for an ED follow up

## 2023-08-23 ENCOUNTER — Other Ambulatory Visit: Payer: Self-pay | Admitting: Emergency Medicine

## 2023-08-23 DIAGNOSIS — E1165 Type 2 diabetes mellitus with hyperglycemia: Secondary | ICD-10-CM

## 2023-09-05 ENCOUNTER — Ambulatory Visit (INDEPENDENT_AMBULATORY_CARE_PROVIDER_SITE_OTHER): Payer: Medicare HMO | Admitting: Emergency Medicine

## 2023-09-05 ENCOUNTER — Ambulatory Visit (INDEPENDENT_AMBULATORY_CARE_PROVIDER_SITE_OTHER): Payer: Medicare HMO

## 2023-09-05 ENCOUNTER — Encounter: Payer: Self-pay | Admitting: Emergency Medicine

## 2023-09-05 VITALS — BP 110/70 | HR 89 | Temp 98.3°F | Ht 63.0 in | Wt 180.0 lb

## 2023-09-05 DIAGNOSIS — M25511 Pain in right shoulder: Secondary | ICD-10-CM | POA: Diagnosis not present

## 2023-09-05 DIAGNOSIS — J069 Acute upper respiratory infection, unspecified: Secondary | ICD-10-CM | POA: Insufficient documentation
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
## 2023-09-05 NOTE — Assessment & Plan Note (Addendum)
 Clinically stable. Running its course without complications. No red flag signs or symptoms.  No signs of pneumonia. No signs of secondary bacterial infection Symptom management discussed Advised to rest and stay well-hydrated ED precautions given Advised to contact the office if no better or worse during the next several days

## 2023-09-05 NOTE — Assessment & Plan Note (Signed)
 Differential diagnosis discussed Tendinitis versus bursitis X-ray done today.  Report reviewed Recommend NSAIDs as needed Pain management discussed Advised to contact the office if no better or worse during the next several days History of shoulder surgery in the past May need to follow-up with orthopedist

## 2023-09-05 NOTE — Patient Instructions (Signed)

## 2023-11-05 ENCOUNTER — Ambulatory Visit: Payer: Medicare HMO | Admitting: Emergency Medicine

## 2023-11-18 ENCOUNTER — Ambulatory Visit: Admitting: Emergency Medicine

## 2023-12-19 ENCOUNTER — Ambulatory Visit
Admission: RE | Admit: 2023-12-19 | Discharge: 2023-12-19 | Disposition: A | Discharge status: Home / Self Care | Source: Ambulatory Visit | Attending: Nurse Practitioner | Admitting: Nurse Practitioner

## 2023-12-19 ENCOUNTER — Other Ambulatory Visit: Payer: Self-pay | Admitting: Nurse Practitioner

## 2023-12-19 DIAGNOSIS — M25562 Pain in left knee: Secondary | ICD-10-CM

## 2023-12-22 ENCOUNTER — Other Ambulatory Visit: Payer: Self-pay | Admitting: Emergency Medicine

## 2024-02-05 ENCOUNTER — Other Ambulatory Visit: Payer: Self-pay | Admitting: Emergency Medicine

## 2024-02-05 DIAGNOSIS — E1165 Type 2 diabetes mellitus with hyperglycemia: Secondary | ICD-10-CM

## 2024-03-23 ENCOUNTER — Ambulatory Visit: Admitting: Podiatry

## 2024-03-23 DIAGNOSIS — M7742 Metatarsalgia, left foot: Secondary | ICD-10-CM | POA: Diagnosis not present

## 2024-03-23 DIAGNOSIS — L84 Corns and callosities: Secondary | ICD-10-CM

## 2024-03-23 DIAGNOSIS — E1165 Type 2 diabetes mellitus with hyperglycemia: Secondary | ICD-10-CM

## 2024-03-23 DIAGNOSIS — M7741 Metatarsalgia, right foot: Secondary | ICD-10-CM | POA: Diagnosis not present

## 2024-03-23 MED ORDER — UREA 40 % EX CREA: 1.0000 | TOPICAL_CREAM | Freq: Two times a day (BID) | CUTANEOUS | 2 refills | Status: AC

## 2024-03-23 NOTE — Progress Notes (Unsigned)
    Subjective:  Patient ID: Damon Dudley, male    DOB: Apr 01, 1961,  MRN: [id number]  Chief Complaint  Patient presents with   skin issue    Pt stated that he has these patches of dry skin on the balls of his feet he has to file them down to get relief     Discussed the use of AI scribe software for clinical note transcription with the patient, who gave verbal consent to proceed.  History of Present Illness Damon Dudley is a 63 year old male with diabetes who presents with rough skin and calluses on his feet.  The rough skin and calluses are primarily on the left foot and have worsened over time, causing pain described as 'a couple of dimes' under the foot. Thick sneakers provide some comfort. Filing the skin down offers temporary relief, allowing him to walk his dog comfortably for a day, but symptoms return the next day. He uses an unspecified foot cream. There are no bleeding or open wounds and no recent injuries. Blood sugar levels are stable.      Objective:    Physical Exam General: AAO x3, NAD  Dermatological: On the plantar aspects bilaterally submetatarsal right side worse than left there is hyperkeratotic lesions.  Upon debridement there is no underlying ulceration, drainage or signs of infection.  No evidence of foreign body or puncture wound.  Vascular: Dorsalis Pedis artery and Posterior Tibial artery pedal pulses are 2/4 bilateral with immedate capillary fill time. There is no pain with calf compression, swelling, warmth, erythema.   Neruologic: Grossly intact via light touch bilateral.  Musculoskeletal: He has discomfort submetatarsal hyperkeratotic lesions right foot worse than left.  There is no area pinpoint tenderness.  Gait: Unassisted, Nonantalgic.     No images are attached to the encounter.    Results Procedure: Callus debridement   Description: Debridement of hyperkeratotic tissue and callus on the feet. Observation of two small nucleated  hyperkeratoses, one of which exhibited minor bleeding. Application of topical antibiotic ointment to the bleeding area.   Assessment:   1. Callus   2. Type 2 diabetes mellitus with hyperglycemia, without long-term current use of insulin (HCC)   3. Metatarsalgia of both feet      Plan:  Patient was evaluated and treated and all questions answered.  Assessment and Plan Assessment & Plan Callus formation on feet Calluses on the ball of the foot due to pressure and walking patterns. One area bled slightly. - Prescribed urea  cream twice daily to thin skin.  Discussing that is over-the-counter if not covered by insurance. - Provided metatarsal pads to reduce pressure. - Sharply debrided hyperkeratotic lesions bilaterally.  Small amount of bleeding occurred in the right side.  Discussed antibiotic ointment dressing changes daily and I did this today.  Monitoring signs or symptoms of infection.  Dry skin on feet Persistent dry skin contributing to callus formation. - Prescribed urea  cream for dry skin and callus management.   Return if symptoms worsen or fail to improve.   Damon Dudley DPM

## 2024-03-23 NOTE — Patient Instructions (Signed)
 Look at getting UREA  40% CREAM  --  Diabetes Mellitus and Foot Care Diabetes, also called diabetes mellitus, may cause problems with your feet and legs because of poor blood flow (circulation). Poor circulation may make your skin: Become thinner and drier. Break more easily. Heal more slowly. Peel and crack. You may also have nerve damage (neuropathy). This can cause decreased feeling in your legs and feet. This means that you may not notice minor injuries to your feet that could lead to more serious problems. Finding and treating problems early is the best way to prevent future foot problems. How to care for your feet Foot hygiene  Wash your feet daily with warm water and mild soap. Do not use hot water. Then, pat your feet and the areas between your toes until they are fully dry. Do not soak your feet. This can dry your skin. Trim your toenails straight across. Do not dig under them or around the cuticle. File the edges of your nails with an emery board or nail file. Apply a moisturizing lotion or petroleum jelly to the skin on your feet and to dry, brittle toenails. Use lotion that does not contain alcohol and is unscented. Do not apply lotion between your toes. Shoes and socks Wear clean socks or stockings every day. Make sure they are not too tight. Do not wear knee-high stockings. These may decrease blood flow to your legs. Wear shoes that fit well and have enough cushioning. Always look in your shoes before you put them on to be sure there are no objects inside. To break in new shoes, wear them for just a few hours a day. This prevents injuries on your feet. Wounds, scrapes, corns, and calluses  Check your feet daily for blisters, cuts, bruises, sores, and redness. If you cannot see the bottom of your feet, use a mirror or ask someone for help. Do not cut off corns or calluses or try to remove them with medicine. If you find a minor scrape, cut, or break in the skin on your feet, keep  it and the skin around it clean and dry. You may clean these areas with mild soap and water. Do not clean the area with peroxide, alcohol, or iodine. If you have a wound, scrape, corn, or callus on your foot, look at it several times a day to make sure it is healing and not infected. Check for: Redness, swelling, or pain. Fluid or blood. Warmth. Pus or a bad smell. General tips Do not cross your legs. This may decrease blood flow to your feet. Do not use heating pads or hot water bottles on your feet. They may burn your skin. If you have lost feeling in your feet or legs, you may not know this is happening until it is too late. Protect your feet from hot and cold by wearing shoes, such as at the beach or on hot pavement. Schedule a complete foot exam at least once a year or more often if you have foot problems. Report any cuts, sores, or bruises to your health care provider right away. Where to find more information American Diabetes Association: diabetes.org Association of Diabetes Care & Education Specialists: diabeteseducator.org Contact a health care provider if: You have a condition that increases your risk of infection, and you have any cuts, sores, or bruises on your feet. You have an injury that is not healing. You have redness on your legs or feet. You feel burning or tingling in your legs or  feet. You have pain or cramps in your legs and feet. Your legs or feet are numb. Your feet always feel cold. You have pain around any toenails. Get help right away if: You have a wound, scrape, corn, or callus on your foot and: You have signs of infection. You have a fever. You have a red line going up your leg. This information is not intended to replace advice given to you by your health care provider. Make sure you discuss any questions you have with your health care provider. Document Revised: 02/14/2022 Document Reviewed: 02/14/2022 Elsevier Patient Education  2024 [employer].

## 2024-05-01 ENCOUNTER — Telehealth: Payer: Self-pay | Admitting: Hematology

## 2024-05-01 ENCOUNTER — Other Ambulatory Visit: Payer: Self-pay

## 2024-05-01 DIAGNOSIS — C8581 Other specified types of non-Hodgkin lymphoma, lymph nodes of head, face, and neck: Secondary | ICD-10-CM

## 2024-05-04 ENCOUNTER — Inpatient Hospital Stay: Payer: Medicare HMO | Admitting: Hematology

## 2024-05-04 ENCOUNTER — Inpatient Hospital Stay: Payer: Medicare HMO

## 2024-05-26 ENCOUNTER — Ambulatory Visit (INDEPENDENT_AMBULATORY_CARE_PROVIDER_SITE_OTHER): Admitting: Emergency Medicine

## 2024-05-26 ENCOUNTER — Ambulatory Visit: Payer: Self-pay | Admitting: Emergency Medicine

## 2024-05-26 ENCOUNTER — Encounter: Payer: Self-pay | Admitting: Emergency Medicine

## 2024-05-26 ENCOUNTER — Ambulatory Visit: Payer: Medicare HMO

## 2024-05-26 VITALS — BP 114/72 | HR 81 | Temp 98.2°F | Ht 63.0 in | Wt 172.0 lb

## 2024-05-26 DIAGNOSIS — I700 Atherosclerosis of aorta: Secondary | ICD-10-CM

## 2024-05-26 DIAGNOSIS — I152 Hypertension secondary to endocrine disorders: Secondary | ICD-10-CM

## 2024-05-26 DIAGNOSIS — E1159 Type 2 diabetes mellitus with other circulatory complications: Secondary | ICD-10-CM | POA: Diagnosis not present

## 2024-05-26 DIAGNOSIS — C8581 Other specified types of non-Hodgkin lymphoma, lymph nodes of head, face, and neck: Secondary | ICD-10-CM | POA: Diagnosis not present

## 2024-05-26 DIAGNOSIS — Z7985 Long-term (current) use of injectable non-insulin antidiabetic drugs: Secondary | ICD-10-CM | POA: Diagnosis not present

## 2024-05-26 DIAGNOSIS — E1165 Type 2 diabetes mellitus with hyperglycemia: Secondary | ICD-10-CM

## 2024-05-26 DIAGNOSIS — Z23 Encounter for immunization: Secondary | ICD-10-CM

## 2024-05-26 LAB — CBC WITH DIFFERENTIAL/PLATELET
Basophils Absolute: 0 K/uL (ref 0.0–0.1)
Basophils Relative: 0.6 % (ref 0.0–3.0)
Eosinophils Absolute: 0.2 K/uL (ref 0.0–0.7)
Eosinophils Relative: 2.8 % (ref 0.0–5.0)
HCT: 44.1 % (ref 39.0–52.0)
Hemoglobin: 14.6 g/dL (ref 13.0–17.0)
Lymphocytes Relative: 23.1 % (ref 12.0–46.0)
Lymphs Abs: 1.3 K/uL (ref 0.7–4.0)
MCHC: 33.1 g/dL (ref 30.0–36.0)
MCV: 87.8 fl (ref 78.0–100.0)
Monocytes Absolute: 0.4 K/uL (ref 0.1–1.0)
Monocytes Relative: 7.6 % (ref 3.0–12.0)
Neutro Abs: 3.8 K/uL (ref 1.4–7.7)
Neutrophils Relative %: 65.9 % (ref 43.0–77.0)
Platelets: 227 K/uL (ref 150.0–400.0)
RBC: 5.02 Mil/uL (ref 4.22–5.81)
RDW: 14 % (ref 11.5–15.5)
WBC: 5.8 K/uL (ref 4.0–10.5)

## 2024-05-26 LAB — LIPID PANEL
Cholesterol: 136 mg/dL (ref 0–200)
HDL: 52.2 mg/dL (ref >39.00)
LDL Cholesterol: 71 mg/dL (ref 0–99)
NonHDL: 83.5
Total CHOL/HDL Ratio: 3
Triglycerides: 62 mg/dL (ref 0.0–149.0)
VLDL: 12.4 mg/dL (ref 0.0–40.0)

## 2024-05-26 LAB — POCT GLYCOSYLATED HEMOGLOBIN (HGB A1C): Hemoglobin A1C: 6.1 % — AB (ref 4.0–5.6)

## 2024-05-26 LAB — COMPREHENSIVE METABOLIC PANEL WITH GFR
ALT: 20 U/L (ref 0–53)
AST: 23 U/L (ref 0–37)
Albumin: 4.6 g/dL (ref 3.5–5.2)
Alkaline Phosphatase: 57 U/L (ref 39–117)
BUN: 13 mg/dL (ref 6–23)
CO2: 29 meq/L (ref 19–32)
Calcium: 9.7 mg/dL (ref 8.4–10.5)
Chloride: 101 meq/L (ref 96–112)
Creatinine, Ser: 0.64 mg/dL (ref 0.40–1.50)
GFR: 99.34 mL/min (ref >60.00)
Glucose, Bld: 60 mg/dL — ABNORMAL LOW (ref 70–99)
Potassium: 3.6 meq/L (ref 3.5–5.1)
Sodium: 139 meq/L (ref 135–145)
Total Bilirubin: 0.5 mg/dL (ref 0.2–1.2)
Total Protein: 6.7 g/dL (ref 6.0–8.3)

## 2024-05-26 LAB — MICROALBUMIN / CREATININE URINE RATIO
Creatinine,U: 143.4 mg/dL
Microalb Creat Ratio: 8.2 mg/g (ref 0.0–30.0)
Microalb, Ur: 1.2 mg/dL (ref 0.0–1.9)

## 2024-05-26 NOTE — Patient Instructions (Signed)
 Mantenimiento de la salud despus de los 65 aos de edad Health Maintenance After Age 65 Despus de los 65 aos de edad, corre un riesgo mayor de Film/video editor enfermedades e infecciones a largo plazo, como tambin de sufrir lesiones por cadas. Las cadas son la causa principal de las fracturas de huesos y lesiones en la cabeza de personas mayores de 65 aos de edad. Recibir cuidados preventivos de forma regular puede ayudarlo a mantenerse saludable y en buen [place]. Los cuidados preventivos incluyen realizarse anlisis de forma regular y Forensic psychologist en el estilo de vida segn las recomendaciones del mdico. Converse con el mdico sobre lo siguiente: Las pruebas de deteccin y los anlisis que debe International aid/development worker. Una prueba de deteccin es un estudio que se para Engineer, manufacturing la presencia de una enfermedad cuando no tiene sntomas. Un plan de dieta y ejercicios adecuado para usted. Qu debo saber sobre las pruebas de deteccin y los anlisis para prevenir cadas? Realizarse pruebas de deteccin y [employer] es la mejor manera de Engineer, manufacturing un problema de salud de forma temprana. El diagnstico y tratamiento tempranos le brindan la mejor oportunidad de Chief Operating Officer las afecciones mdicas que son comunes despus de los 65 aos de edad. Ciertas afecciones y elecciones de estilo de vida pueden hacer que sea ms propenso a sufrir Engineer, site. El mdico puede recomendarle lo siguiente: Controles regulares de la visin. Una visin deficiente y afecciones como las cataratas pueden hacer que sea ms propenso a sufrir Engineer, site. Si usa  lentes, asegrese de obtener una receta actualizada si su visin cambia. Revisin de medicamentos. Revise regularmente con el mdico todos los medicamentos que toma, incluidos los medicamentos de [place]. Consulte al [employer] efectos secundarios que pueden hacer que sea ms propenso a sufrir Engineer, site. Informe al mdico si alguno de los medicamentos que toma lo hace sentir mareado o  somnoliento. Controles de fuerza y equilibrio. El mdico puede recomendar ciertos estudios para controlar su fuerza y equilibrio al estar de pie, al caminar o al cambiar de posicin. Examen de los pies. El dolor y Materials engineer en los pies, como tambin no utilizar el calzado adecuado, pueden hacer que sea ms propenso a sufrir Engineer, site. Pruebas de deteccin, que incluyen las siguientes: Pruebas de deteccin para la osteoporosis. La osteoporosis es una afeccin que hace que los huesos se tornen ms dbiles y se quiebren con ms facilidad. Pruebas de deteccin para la presin arterial. Los cambios en la presin arterial y los medicamentos para Chief Operating Officer la presin arterial pueden hacerlo sentir mareado. Prueba de deteccin de la depresin. Es ms probable que sufra una cada si tiene miedo a caerse, se siente deprimido o se siente incapaz de Probation officer. Prueba de deteccin de consumo de alcohol. Beber demasiado alcohol puede afectar su equilibrio y puede hacer que sea ms propenso a sufrir Engineer, site. Siga estas indicaciones en su casa: Estilo de vida No beba alcohol si: Su mdico le indica no hacerlo. Si bebe alcohol: Limite la cantidad que bebe a lo siguiente: De 0 a 1 medida por da para las mujeres. De 0 a 2 medidas por da para los hombres. Sepa cunta cantidad de alcohol hay en las bebidas que toma. En los [address], una medida equivale a una botella de cerveza de 12 oz (355 ml), un vaso de vino de 5 oz (148 ml) o un vaso de una bebida alcohlica de alta graduacin de 1 oz (44 ml). No consuma ningn producto que  contenga nicotina o tabaco. Estos productos incluyen cigarrillos, tabaco para mascar y aparatos de vapeo, como los cigarrillos electrnicos. Si necesita ayuda para dejar de consumir estos productos, consulte al [employer]. Actividad  Siga un programa de ejercicio regular para mantenerse en forma. Esto lo ayudar a Radio producer equilibrio. Consulte al  mdico qu tipos de ejercicios son adecuados para usted. Si necesita un bastn o un andador, selo segn las recomendaciones del mdico. Utilice calzado con buen apoyo y suela antideslizante. Seguridad  [employer] los [employer] puedan causar tropiezos tales como alfombras, cables u obstculos. Instale equipos de seguridad, como barras para sostn en los baos y barandas de seguridad en las escaleras. Mantenga las habitaciones y los pasillos bien iluminados. Indicaciones generales Hable con el mdico sobre sus riesgos de sufrir una cada. Infrmele a su mdico si: Se cae. Asegrese de informarle a su mdico acerca de todas las cadas, incluso aquellas que parecen ser [employer]. Se siente mareado, cansado (tiene fatiga) o siente que pierde el equilibrio. Use los medicamentos de venta libre y los recetados solamente como se lo haya indicado el mdico. Estos incluyen suplementos. Siga una dieta sana y [place] un peso saludable. Una dieta saludable incluye productos lcteos descremados, carnes bajas en contenido de grasa ([place]), fibra de granos enteros, frijoles y [place] frutas y verduras. Mantngase al da con las vacunas. Realcese los estudios de rutina de la salud, dentales y de Wellsite geologist. Resumen Tener un estilo de vida saludable y recibir cuidados preventivos pueden ayudar a Research scientist (physical sciences) salud y el bienestar despus de los 65 aos de [place]. Realizarse pruebas de deteccin y anlisis es la mejor manera de Engineer, manufacturing un problema de salud de forma temprana y ayudarlo a Automotive engineer una cada. El diagnstico y tratamiento tempranos le brindan la mejor oportunidad de Chief Operating Officer las afecciones mdicas ms comunes en las personas mayores de 65 aos de edad. Las cadas son la causa principal de las fracturas de huesos y lesiones en la cabeza de personas mayores de 65 aos de edad. Tome precauciones para evitar una cada en su casa. Trabaje con el mdico para saber qu cambios que puede hacer para mejorar su salud y  [place], y para prevenir las cadas. Esta informacin no tiene Theme park manager el consejo del mdico. Asegrese de hacerle al mdico cualquier pregunta que tenga. Document Revised: 01/18/2021 Document Reviewed: 01/18/2021 Elsevier Patient Education  2024 [employer].

## 2024-05-26 NOTE — Assessment & Plan Note (Signed)
 Uncontrolled diabetes with hemoglobin A1c of 6.1 Continue present management of Tresiba , Mounjaro , and metformin  as outlined above

## 2024-05-26 NOTE — Assessment & Plan Note (Signed)
Diet and nutrition discussed. Continue atorvastatin 40 mg daily. Lipid profile done today 
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
## 2024-05-26 NOTE — Assessment & Plan Note (Signed)
Stable and well-controlled.  Asymptomatic. Most recent oncologist office visit notes reviewed with patient No concerns.

## 2024-05-26 NOTE — Assessment & Plan Note (Signed)
 BP Readings from Last 3 Encounters:  05/26/24 114/72  09/05/23 110/70  05/08/23 124/88  Well-controlled hypertension Continue Hyzaar 100-25 mg daily Well-controlled diabetes with hemoglobin A1c 6.1 Continue weekly Mounjaro  7.5 mg, daily metformin  1000 mg twice a day, Tresiba  25 units daily Cardiovascular risks associated with hypertension and diabetes discussed Diet and nutrition discussed

## 2024-06-05 ENCOUNTER — Other Ambulatory Visit: Payer: Self-pay

## 2024-06-05 DIAGNOSIS — C8581 Other specified types of non-Hodgkin lymphoma, lymph nodes of head, face, and neck: Secondary | ICD-10-CM

## 2024-06-08 ENCOUNTER — Inpatient Hospital Stay: Admitting: Hematology

## 2024-06-08 ENCOUNTER — Inpatient Hospital Stay: Attending: Emergency Medicine
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
## 2024-06-28 ENCOUNTER — Other Ambulatory Visit: Payer: Self-pay | Admitting: Emergency Medicine

## 2024-06-28 DIAGNOSIS — E1165 Type 2 diabetes mellitus with hyperglycemia: Secondary | ICD-10-CM

## 2024-07-21 ENCOUNTER — Inpatient Hospital Stay: Attending: Emergency Medicine

## 2024-07-21 ENCOUNTER — Inpatient Hospital Stay (HOSPITAL_BASED_OUTPATIENT_CLINIC_OR_DEPARTMENT_OTHER): Admitting: Hematology

## 2024-07-21 VITALS — BP 112/74 | HR 72 | Temp 97.7°F | Resp 20 | Wt 175.4 lb

## 2024-07-21 DIAGNOSIS — Z7985 Long-term (current) use of injectable non-insulin antidiabetic drugs: Secondary | ICD-10-CM | POA: Insufficient documentation

## 2024-07-21 DIAGNOSIS — C8581 Other specified types of non-Hodgkin lymphoma, lymph nodes of head, face, and neck: Secondary | ICD-10-CM

## 2024-07-21 DIAGNOSIS — C859A Non-Hodgkin lymphoma, unspecified, in remission: Secondary | ICD-10-CM | POA: Diagnosis present

## 2024-07-21 DIAGNOSIS — I10 Essential (primary) hypertension: Secondary | ICD-10-CM | POA: Diagnosis not present

## 2024-07-21 DIAGNOSIS — E785 Hyperlipidemia, unspecified: Secondary | ICD-10-CM | POA: Diagnosis not present

## 2024-07-21 DIAGNOSIS — D696 Thrombocytopenia, unspecified: Secondary | ICD-10-CM | POA: Insufficient documentation

## 2024-07-21 DIAGNOSIS — Z87891 Personal history of nicotine dependence: Secondary | ICD-10-CM | POA: Insufficient documentation

## 2024-07-21 DIAGNOSIS — Z794 Long term (current) use of insulin: Secondary | ICD-10-CM | POA: Diagnosis not present

## 2024-07-21 DIAGNOSIS — Z79899 Other long term (current) drug therapy: Secondary | ICD-10-CM | POA: Diagnosis not present

## 2024-07-21 DIAGNOSIS — E119 Type 2 diabetes mellitus without complications: Secondary | ICD-10-CM | POA: Insufficient documentation

## 2024-07-21 DIAGNOSIS — Z7984 Long term (current) use of oral hypoglycemic drugs: Secondary | ICD-10-CM | POA: Insufficient documentation

## 2024-07-21 LAB — CBC WITH DIFFERENTIAL (CANCER CENTER ONLY)
Abs Immature Granulocytes: 0.01 K/uL (ref 0.00–0.07)
Basophils Absolute: 0 K/uL (ref 0.0–0.1)
Basophils Relative: 0 %
Eosinophils Absolute: 0.2 K/uL (ref 0.0–0.5)
Eosinophils Relative: 3 %
HCT: 40 % (ref 39.0–52.0)
Hemoglobin: 13.4 g/dL (ref 13.0–17.0)
Immature Granulocytes: 0 %
Lymphocytes Relative: 22 %
Lymphs Abs: 1.2 K/uL (ref 0.7–4.0)
MCH: 28.6 pg (ref 26.0–34.0)
MCHC: 33.5 g/dL (ref 30.0–36.0)
MCV: 85.3 fL (ref 80.0–100.0)
Monocytes Absolute: 0.4 K/uL (ref 0.1–1.0)
Monocytes Relative: 7 %
Neutro Abs: 3.5 K/uL (ref 1.7–7.7)
Neutrophils Relative %: 68 %
Platelet Count: 241 K/uL (ref 150–400)
RBC: 4.69 MIL/uL (ref 4.22–5.81)
RDW: 13.2 % (ref 11.5–15.5)
WBC Count: 5.3 K/uL (ref 4.0–10.5)
nRBC: 0 % (ref 0.0–0.2)

## 2024-07-21 LAB — CMP (CANCER CENTER ONLY)
ALT: 21 U/L (ref 0–44)
AST: 25 U/L (ref 15–41)
Albumin: 4.5 g/dL (ref 3.5–5.0)
Alkaline Phosphatase: 79 U/L (ref 38–126)
Anion gap: 9 (ref 5–15)
BUN: 15 mg/dL (ref 8–23)
CO2: 29 mmol/L (ref 22–32)
Calcium: 9.6 mg/dL (ref 8.9–10.3)
Chloride: 102 mmol/L (ref 98–111)
Creatinine: 0.62 mg/dL (ref 0.61–1.24)
GFR, Estimated: >60 mL/min (ref >60)
Glucose, Bld: 145 mg/dL — ABNORMAL HIGH (ref 70–99)
Potassium: 3.7 mmol/L (ref 3.5–5.1)
Sodium: 140 mmol/L (ref 135–145)
Total Bilirubin: 0.4 mg/dL (ref 0.0–1.2)
Total Protein: 6.4 g/dL — ABNORMAL LOW (ref 6.5–8.1)

## 2024-07-21 LAB — LACTATE DEHYDROGENASE: LDH: 171 U/L (ref 105–235)
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
## 2024-07-31 ENCOUNTER — Ambulatory Visit

## 2024-08-24 ENCOUNTER — Ambulatory Visit

## 2024-11-23 ENCOUNTER — Ambulatory Visit: Admitting: Emergency Medicine

## 2025-07-26 ENCOUNTER — Inpatient Hospital Stay: Admitting: Hematology

## 2025-07-26 ENCOUNTER — Inpatient Hospital Stay
